# Patient Record
Sex: Male | Born: 1945 | Race: White | Hispanic: No | Marital: Married | Smoking: Never smoker
Health system: Southern US, Community
[De-identification: ages and names within clinical notes are randomized; demographics above are authoritative.]

## PROBLEM LIST (undated history)

## (undated) DIAGNOSIS — M1990 Unspecified osteoarthritis, unspecified site: Secondary | ICD-10-CM

## (undated) HISTORY — PX: BACK SURGERY: SHX140

---

## 2006-09-26 ENCOUNTER — Emergency Department (HOSPITAL_COMMUNITY): Admission: EM | Admit: 2006-09-26 | Discharge: 2006-09-27 | Payer: Self-pay | Admitting: Emergency Medicine

## 2006-10-15 ENCOUNTER — Ambulatory Visit (HOSPITAL_COMMUNITY): Admission: RE | Admit: 2006-10-15 | Discharge: 2006-10-16 | Payer: Self-pay | Admitting: Neurosurgery

## 2008-06-27 IMAGING — CR DG LUMBAR SPINE 2-3V
1 series · 1 of 1 positions shown · non-contrast
Comparison: None.

CLINICAL DATA: 60-year-old male with herniated nucleus pulposus, L4-5 diskectomy.
 LUMBAR SPINE - 2 VIEW:

[view not recorded]
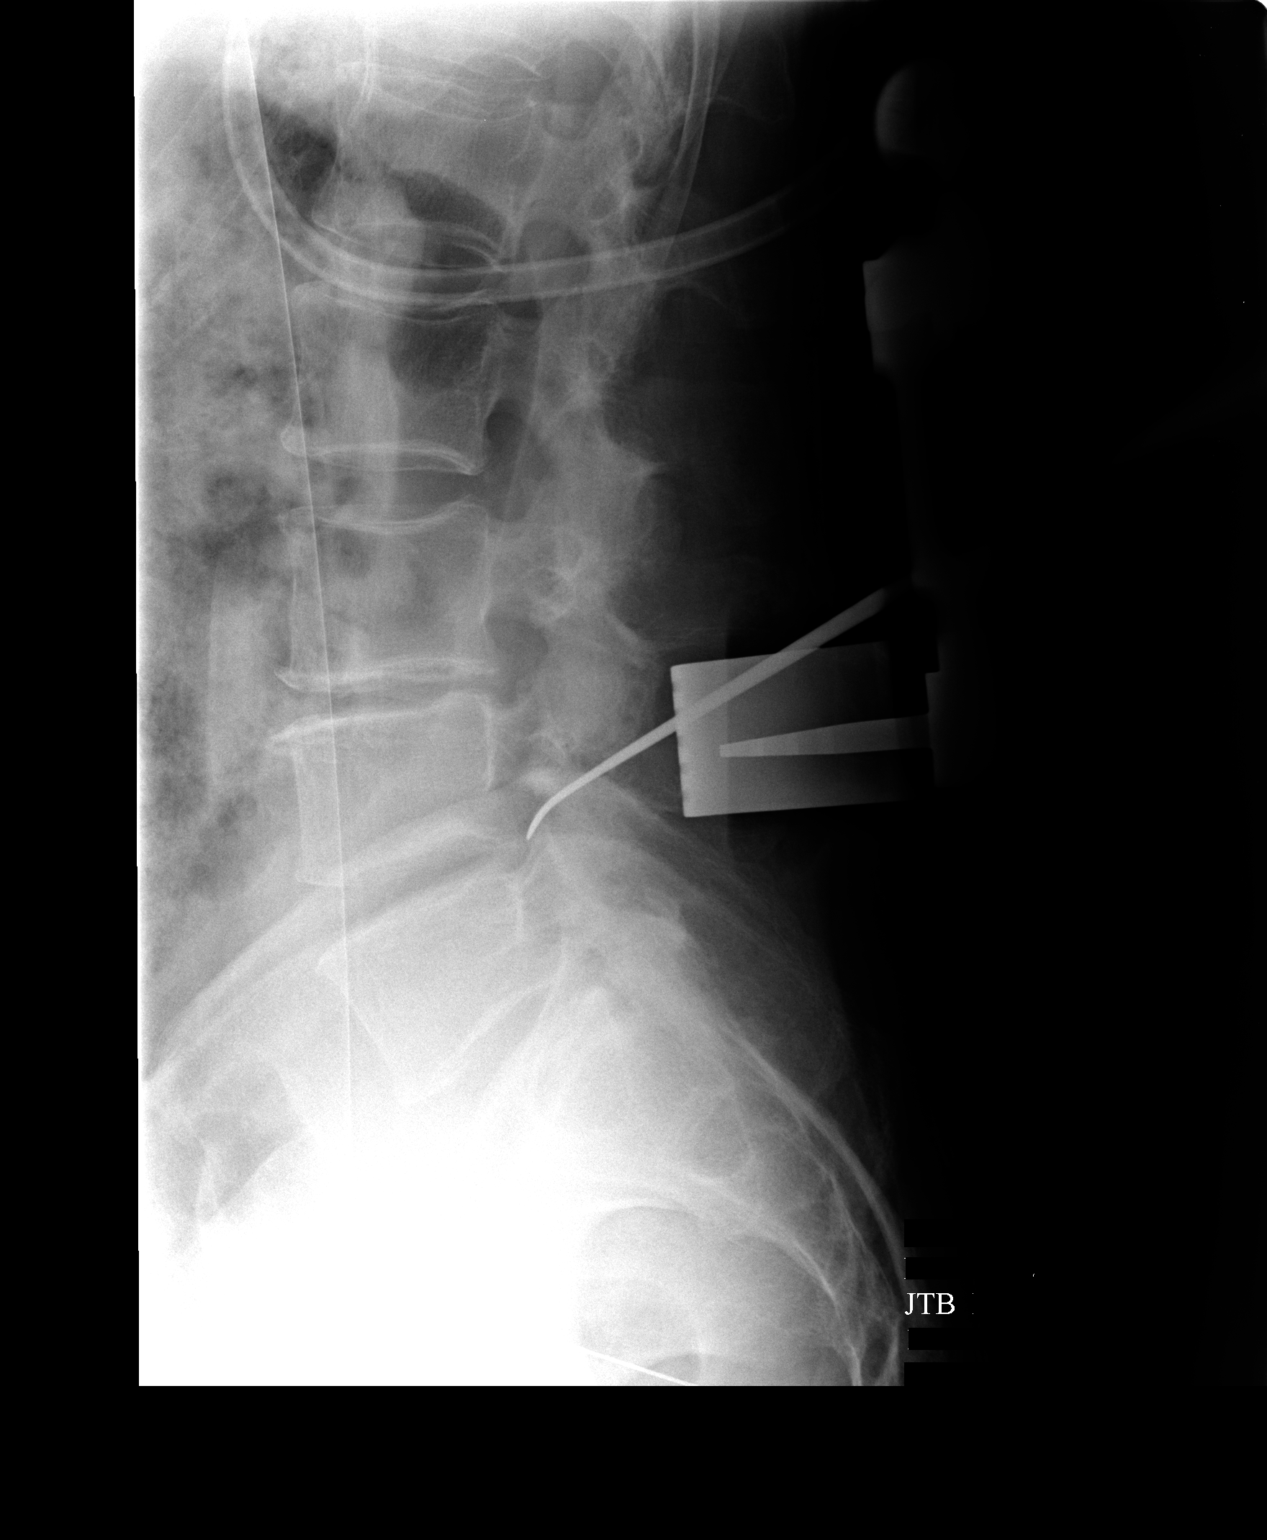

[1 of 1 positions shown; findings below may reference images not displayed]

FINDINGS: Two cross-table portable views of the lumbar spine performed at 5748 and 7986 hours. The earlier view demonstrates two surgical probes, one along the posterior aspect of the L4 level and the second just above the L4-5 disk space. The later film shows a probe at the level of the L4-L5 disk space.
IMPRESSION: Surgical localization as above.

## 2010-08-05 NOTE — Op Note (Signed)
[name], Bryan Mcgee               ACCOUNT NO.:  [id number]   MEDICAL RECORD NO.:  [id number]          PATIENT TYPE:  OIB   LOCATION:  5151                         FACILITY:  [facility]   PHYSICIAN:  Hilda Lias, M.D.   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   POSTOPERATIVE DIAGNOSES:  Right L4-5 herniated disk with 3-4  degenerative disk disease.   PROCEDURE:  Right L4 hemilaminectomy, removal of four large free  fragments at 4-5.  Lysis of adhesions, total gross diskectomy,  foraminotomy to decompress the L3 nerve root.   SURGEON:  Hilda Lias, M.D.   ASSISTANT:  Clydene Fake, M.D.   CLINICAL HISTORY:  The patient was seen in my office yesterday,  essentially with foot drop in the right leg.  The patient previously  about 20 years ago had surgery at the level 4-5.  The patient want to  proceed with surgery because of the amount of weakness he had as well as  pain.  The risks were explained to him in history and physical.   PROCEDURE:  The patient was taken to the OR and she was positioned in  prone manner.  The skin was cleaned with DuraPrep.  A midline incision  following the previous one was made.  We found immediately underneath  the subcutaneous tissue quite a bit of scar tissue.  There was quite a  bit of adhesion to the spinous process and lamina.  Retraction was made.  We started working our way down and drilling.  X-rays showed that indeed  that area was the level of 3-4.  Then we went one space below, we  repeated the x-ray and we were at the level 4-5.  At this level we did  find not find any scar tissue whatsoever.  We started working our way  until we found the yellow ligament.  Incision of the yellow ligament was  accomplished and we found the L4-5 disk space.  Here we  found that the  L4 nerve roots was quite adherent to the disk.  We did a lysis of  adhesion and after retraction we were able to remove four large  fragment, one going from 4-5 into the body of L5.  Nevertheless still  the L4 nerve root was really adherent to the canal.  Then we worked our  way up we did hemilaminectomy of L4.  Then we started working doing  quite a bit of lysis of adhesions which were compromising the dural sac  as well as the L3 and L4 nerve root.  Lysis was accomplished and at the  end we had good foraminotomy to decompress the L4 nerve root.  The L5  nerve root was quite open after we did a foraminotomy with 2 and 3  Kerrison punch.  From then on the area was irrigated.  Valsalva maneuver  was negative.  The wound was closed with Vicryl and Steri-Strips.  ______________________________  Hilda Lias, M.D.     EB/MEDQ  D:  10/15/2006  T:  10/16/2006  Job:  [id number]

## 2011-01-05 LAB — CBC
HCT: 39
Hemoglobin: 13.3
RDW: 12
WBC: 6

## 2011-01-05 LAB — BASIC METABOLIC PANEL
Calcium: 9.1
GFR calc Af Amer: >60
GFR calc non Af Amer: >60
Potassium: 4.2
Sodium: 141

## 2013-01-20 DIAGNOSIS — N400 Enlarged prostate without lower urinary tract symptoms: Secondary | ICD-10-CM | POA: Insufficient documentation

## 2016-01-09 DIAGNOSIS — E7800 Pure hypercholesterolemia, unspecified: Secondary | ICD-10-CM | POA: Insufficient documentation

## 2016-06-09 DIAGNOSIS — M545 Low back pain, unspecified: Secondary | ICD-10-CM | POA: Insufficient documentation

## 2016-06-09 DIAGNOSIS — G8929 Other chronic pain: Secondary | ICD-10-CM | POA: Insufficient documentation

## 2017-11-08 ENCOUNTER — Other Ambulatory Visit: Payer: Self-pay | Admitting: Neurosurgery

## 2017-11-08 DIAGNOSIS — M5416 Radiculopathy, lumbar region: Secondary | ICD-10-CM

## 2018-02-02 ENCOUNTER — Ambulatory Visit
Admission: RE | Admit: 2018-02-02 | Discharge: 2018-02-02 | Disposition: A | Discharge status: Home / Self Care | Payer: Medicare Other | Source: Ambulatory Visit | Attending: Neurosurgery | Admitting: Neurosurgery

## 2018-02-02 ENCOUNTER — Other Ambulatory Visit: Payer: Self-pay | Admitting: Neurosurgery

## 2018-02-02 DIAGNOSIS — M5416 Radiculopathy, lumbar region: Secondary | ICD-10-CM

## 2018-02-02 MED ORDER — METHYLPREDNISOLONE ACETATE 40 MG/ML INJ SUSP (RADIOLOG: 120.0000 mg | Freq: Once | INTRAMUSCULAR | Status: DC

## 2018-02-02 MED ORDER — IOPAMIDOL (ISOVUE-M 200) INJECTION 41%: 1.0000 mL | Freq: Once | INTRAMUSCULAR | Status: DC

## 2018-02-02 NOTE — Discharge Instructions (Signed)

## 2018-03-28 ENCOUNTER — Other Ambulatory Visit: Payer: Self-pay | Admitting: Neurosurgery

## 2018-03-28 DIAGNOSIS — M5416 Radiculopathy, lumbar region: Secondary | ICD-10-CM

## 2018-04-05 ENCOUNTER — Ambulatory Visit
Admission: RE | Admit: 2018-04-05 | Discharge: 2018-04-05 | Disposition: A | Discharge status: Home / Self Care | Payer: Medicare Other | Source: Ambulatory Visit | Attending: Neurosurgery | Admitting: Neurosurgery

## 2018-04-05 ENCOUNTER — Other Ambulatory Visit: Payer: Self-pay | Admitting: Neurosurgery

## 2018-04-05 DIAGNOSIS — M5416 Radiculopathy, lumbar region: Secondary | ICD-10-CM

## 2018-04-05 MED ORDER — METHYLPREDNISOLONE ACETATE 40 MG/ML INJ SUSP (RADIOLOG
120.0000 mg | Freq: Once | INTRAMUSCULAR | Status: AC
  Administered 2018-04-05: 120 mg via EPIDURAL

## 2018-04-05 MED ORDER — IOPAMIDOL (ISOVUE-M 200) INJECTION 41%
1.0000 mL | Freq: Once | INTRAMUSCULAR | Status: AC
  Administered 2018-04-05: 1 mL via EPIDURAL

## 2018-04-05 NOTE — Discharge Instructions (Signed)

## 2018-05-25 ENCOUNTER — Emergency Department (HOSPITAL_COMMUNITY): Payer: Medicare Other

## 2018-05-25 ENCOUNTER — Other Ambulatory Visit: Payer: Self-pay

## 2018-05-25 ENCOUNTER — Inpatient Hospital Stay (HOSPITAL_COMMUNITY)
Admission: EM | Admit: 2018-05-25 | Discharge: 2018-05-28 | DRG: 520 | Disposition: A | Discharge status: Home Health | Payer: Medicare Other | Attending: Neurosurgery | Admitting: Neurosurgery

## 2018-05-25 ENCOUNTER — Encounter (HOSPITAL_COMMUNITY): Payer: Self-pay | Admitting: Internal Medicine

## 2018-05-25 DIAGNOSIS — Z7982 Long term (current) use of aspirin: Secondary | ICD-10-CM | POA: Diagnosis not present

## 2018-05-25 DIAGNOSIS — T404X5A Adverse effect of other synthetic narcotics, initial encounter: Secondary | ICD-10-CM | POA: Diagnosis present

## 2018-05-25 DIAGNOSIS — M5116 Intervertebral disc disorders with radiculopathy, lumbar region: Secondary | ICD-10-CM | POA: Diagnosis present

## 2018-05-25 DIAGNOSIS — K5903 Drug induced constipation: Secondary | ICD-10-CM | POA: Diagnosis present

## 2018-05-25 DIAGNOSIS — M545 Low back pain: Secondary | ICD-10-CM | POA: Diagnosis present

## 2018-05-25 DIAGNOSIS — M5410 Radiculopathy, site unspecified: Secondary | ICD-10-CM | POA: Diagnosis present

## 2018-05-25 DIAGNOSIS — G8929 Other chronic pain: Secondary | ICD-10-CM | POA: Diagnosis not present

## 2018-05-25 DIAGNOSIS — Z791 Long term (current) use of non-steroidal anti-inflammatories (NSAID): Secondary | ICD-10-CM | POA: Diagnosis not present

## 2018-05-25 DIAGNOSIS — Z7401 Bed confinement status: Secondary | ICD-10-CM | POA: Diagnosis not present

## 2018-05-25 DIAGNOSIS — R300 Dysuria: Secondary | ICD-10-CM | POA: Diagnosis present

## 2018-05-25 DIAGNOSIS — M4726 Other spondylosis with radiculopathy, lumbar region: Secondary | ICD-10-CM | POA: Diagnosis present

## 2018-05-25 DIAGNOSIS — Z79899 Other long term (current) drug therapy: Secondary | ICD-10-CM

## 2018-05-25 DIAGNOSIS — M5126 Other intervertebral disc displacement, lumbar region: Secondary | ICD-10-CM | POA: Diagnosis present

## 2018-05-25 DIAGNOSIS — M5416 Radiculopathy, lumbar region: Secondary | ICD-10-CM | POA: Diagnosis present

## 2018-05-25 DIAGNOSIS — K5900 Constipation, unspecified: Secondary | ICD-10-CM | POA: Diagnosis present

## 2018-05-25 DIAGNOSIS — Z419 Encounter for procedure for purposes other than remedying health state, unspecified: Secondary | ICD-10-CM

## 2018-05-25 DIAGNOSIS — M1990 Unspecified osteoarthritis, unspecified site: Secondary | ICD-10-CM | POA: Diagnosis not present

## 2018-05-25 LAB — CBC WITH DIFFERENTIAL/PLATELET
ABS IMMATURE GRANULOCYTES: 0.01 10*3/uL (ref 0.00–0.07)
BASOS ABS: 0 10*3/uL (ref 0.0–0.1)
Basophils Relative: 0 %
EOS ABS: 0.1 10*3/uL (ref 0.0–0.5)
Eosinophils Relative: 1 %
HCT: 44.9 % (ref 39.0–52.0)
Hemoglobin: 14.7 g/dL (ref 13.0–17.0)
IMMATURE GRANULOCYTES: 0 %
LYMPHS ABS: 1.4 10*3/uL (ref 0.7–4.0)
Lymphocytes Relative: 25 %
MCH: 29.9 pg (ref 26.0–34.0)
MCHC: 32.7 g/dL (ref 30.0–36.0)
MCV: 91.4 fL (ref 80.0–100.0)
Monocytes Absolute: 0.5 10*3/uL (ref 0.1–1.0)
Monocytes Relative: 9 %
NEUTROS ABS: 3.7 10*3/uL (ref 1.7–7.7)
NEUTROS PCT: 65 %
NRBC: 0 % (ref 0.0–0.2)
PLATELETS: 209 10*3/uL (ref 150–400)
RBC: 4.91 MIL/uL (ref 4.22–5.81)
RDW: 12.2 % (ref 11.5–15.5)
WBC: 5.7 10*3/uL (ref 4.0–10.5)

## 2018-05-25 LAB — BASIC METABOLIC PANEL
ANION GAP: 9 (ref 5–15)
BUN: 14 mg/dL (ref 8–23)
CALCIUM: 9 mg/dL (ref 8.9–10.3)
CO2: 25 mmol/L (ref 22–32)
Chloride: 104 mmol/L (ref 98–111)
Creatinine, Ser: 0.97 mg/dL (ref 0.61–1.24)
Glucose, Bld: 98 mg/dL (ref 70–99)
POTASSIUM: 3.9 mmol/L (ref 3.5–5.1)
Sodium: 138 mmol/L (ref 135–145)

## 2018-05-25 LAB — URINALYSIS, ROUTINE W REFLEX MICROSCOPIC
Bilirubin Urine: NEGATIVE
Glucose, UA: NEGATIVE mg/dL
HGB URINE DIPSTICK: NEGATIVE
Ketones, ur: NEGATIVE mg/dL
LEUKOCYTE UA: NEGATIVE
Nitrite: NEGATIVE
Protein, ur: NEGATIVE mg/dL
SPECIFIC GRAVITY, URINE: 1.01 (ref 1.005–1.030)
pH: 7 (ref 5.0–8.0)

## 2018-05-25 MED ORDER — DEXAMETHASONE SODIUM PHOSPHATE 10 MG/ML IJ SOLN
10.0000 mg | Freq: Once | INTRAMUSCULAR | Status: AC
  Administered 2018-05-25: 10 mg via INTRAVENOUS
  Filled 2018-05-25: qty 1

## 2018-05-25 MED ORDER — DIAZEPAM 5 MG PO TABS: 5.0000 mg | ORAL_TABLET | Freq: Three times a day (TID) | ORAL | Status: DC | PRN

## 2018-05-25 MED ORDER — GADOBUTROL 1 MMOL/ML IV SOLN
8.0000 mL | Freq: Once | INTRAVENOUS | Status: AC | PRN
  Administered 2018-05-25: 8 mL via INTRAVENOUS

## 2018-05-25 MED ORDER — HYDROMORPHONE HCL 1 MG/ML IJ SOLN
1.0000 mg | Freq: Once | INTRAMUSCULAR | Status: AC
  Administered 2018-05-25: 1 mg via INTRAVENOUS
  Filled 2018-05-25: qty 1

## 2018-05-25 MED ORDER — POLYETHYLENE GLYCOL 3350 17 G PO PACK
17.0000 g | PACK | Freq: Every day | ORAL | Status: DC
  Administered 2018-05-26 (×2): 17 g via ORAL
  Filled 2018-05-25 (×2): qty 1

## 2018-05-25 MED ORDER — DIAZEPAM 5 MG/ML IJ SOLN
5.0000 mg | Freq: Once | INTRAMUSCULAR | Status: AC
  Administered 2018-05-25: 5 mg via INTRAVENOUS
  Filled 2018-05-25: qty 2

## 2018-05-25 MED ORDER — HYDROMORPHONE HCL 1 MG/ML IJ SOLN
1.0000 mg | INTRAMUSCULAR | Status: DC | PRN
  Administered 2018-05-26: 1 mg via INTRAVENOUS
  Filled 2018-05-25: qty 1

## 2018-05-25 MED ORDER — SODIUM CHLORIDE 0.9 % IV BOLUS
1000.0000 mL | Freq: Once | INTRAVENOUS | Status: AC
  Administered 2018-05-25: 1000 mL via INTRAVENOUS

## 2018-05-25 MED ORDER — ONDANSETRON HCL 4 MG/2ML IJ SOLN
4.0000 mg | Freq: Four times a day (QID) | INTRAMUSCULAR | Status: DC | PRN
  Administered 2018-05-26: 4 mg via INTRAVENOUS
  Filled 2018-05-25: qty 2

## 2018-05-25 MED ORDER — ONDANSETRON HCL 4 MG PO TABS: 4.0000 mg | ORAL_TABLET | Freq: Four times a day (QID) | ORAL | Status: DC | PRN

## 2018-05-25 MED ORDER — ENOXAPARIN SODIUM 40 MG/0.4ML ~~LOC~~ SOLN
40.0000 mg | Freq: Every day | SUBCUTANEOUS | Status: DC
  Administered 2018-05-26: 40 mg via SUBCUTANEOUS
  Filled 2018-05-25: qty 0.4

## 2018-05-25 MED ORDER — SENNOSIDES-DOCUSATE SODIUM 8.6-50 MG PO TABS
1.0000 | ORAL_TABLET | Freq: Two times a day (BID) | ORAL | Status: DC
  Administered 2018-05-26 – 2018-05-27 (×4): 1 via ORAL
  Filled 2018-05-25 (×4): qty 1
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
## 2018-05-25 NOTE — Consult Note (Signed)
Reason for Consult:lumbar disc herniation Referring Physician: EDP  Bryan Mcgee is an 73 y.o. male.   HPI:  Pleasant 73 year old presented to the ED today after progressive pain in his lower back over the last 2 weeks. He has a history of lower back and right leg pain since last august. He has been seeing Dr. Jordan Likes for this. He has had two epidural steroid injections with little relief. His pain radiates from his lower back to his right leg into the top of his foot to his big toe. Endorses NT and some weakness when trying to ambulate. Has been taking tramadol and tylenol for the pain with little relief. He has been unable to walk because of the pain  History reviewed. No pertinent past medical history.  History reviewed. No pertinent surgical history.  No Known Allergies  Social History   Tobacco Use  . Smoking status: Never Smoker  Substance Use Topics  . Alcohol use: Never    Frequency: Never    Family History  Problem Relation Age of Onset  . Melanoma Mother   . Subarachnoid hemorrhage Father   . Heart failure Father      Review of Systems  Positive ROS: as above  All other systems have been reviewed and were otherwise negative with the exception of those mentioned in the HPI and as above.  Objective: Vital signs in last 24 hours: Temp:  [97.8 F (36.6 C)] 97.8 F (36.6 C) (03/04 1153) Pulse Rate:  [55-72] 68 (03/04 2100) Resp:  [16-18] 16 (03/04 1500) BP: (80-150)/(53-88) 107/54 (03/04 2100) SpO2:  [90 %-99 %] 95 % (03/04 2100)  General Appearance: Alert, cooperative, no distress, appears stated age Head: Normocephalic, without obvious abnormality, atraumatic Eyes: PERRL, conjunctiva/corneas clear, EOM's intact, fundi benign, both eyes      Lungs: respirations unlabored Heart: Regular rate and rhythm NEUROLOGIC:   Mental status: A&O x4, no aphasia, good attention span, Memory and fund of knowledge Motor Exam - grossly normal, normal tone and bulk, strength in  lower extremities is 5/5- EHL, knee extension, hip flexion, dorsi and plantar flexion completely intact.  Sensory Exam - grossly normal Reflexes: symmetric, no pathologic reflexes, No Hoffman's, No clonus Coordination - grossly normal Gait -unable to test Balance - unable to test Cranial Nerves: I: smell Not tested  II: visual acuity  OS: na    OD: na  II: visual fields Full to confrontation  II: pupils Equal, round, reactive to light  III,VII: ptosis None  III,IV,VI: extraocular muscles  Full ROM  V: mastication   V: facial light touch sensation    V,VII: corneal reflex    VII: facial muscle function - upper    VII: facial muscle function - lower   VIII: hearing   IX: soft palate elevation    IX,X: gag reflex   XI: trapezius strength    XI: sternocleidomastoid strength   XI: neck flexion strength    XII: tongue strength      Data Review Lab Results  Component Value Date   WBC 5.7 05/25/2018   HGB 14.7 05/25/2018   HCT 44.9 05/25/2018   MCV 91.4 05/25/2018   PLT 209 05/25/2018   Lab Results  Component Value Date   NA 138 05/25/2018   K 3.9 05/25/2018   CL 104 05/25/2018   CO2 25 05/25/2018   BUN 14 05/25/2018   CREATININE 0.97 05/25/2018   GLUCOSE 98 05/25/2018   No results found for: INR, PROTIME  Radiology: Mr Lumbar Spine W Wo Contrast  Result Date: 05/25/2018 CLINICAL DATA:  Worsening back pain with inability to walk. EXAM: MRI LUMBAR SPINE WITHOUT AND WITH CONTRAST TECHNIQUE: Multiplanar and multiecho pulse sequences of the lumbar spine were obtained without and with intravenous contrast. CONTRAST:  8 mL Gadavist intravenous contrast. COMPARISON:  MRI lumbar spine dated October 27, 2017. FINDINGS: Segmentation:  Standard. Alignment:  Unchanged trace anterolisthesis at L2-L3 at L5-S1. Vertebrae:  No fracture, evidence of discitis, or bone lesion. Conus medullaris and cauda equina: Conus extends to the T12-L1 level. Conus and cauda equina appear normal. No intradural  enhancement. Paraspinal and other soft tissues: Negative. Disc levels: T12-L1:  Negative. L1-L2:  Negative. L2-L3: Unchanged mild disc bulging and bilateral facet arthropathy. No stenosis. L3-L4: Prior right hemilaminectomy. Unchanged disc bulging with superimposed right subarticular disc extrusion migrating inferiorly likely impinging on the descending right L4 nerve root. Unchanged superimposed left subarticular disc protrusion and annular fissure impinging on the descending left L4 nerve root. No spinal canal or neuroforaminal stenosis. L4-L5: Prior right hemilaminectomy. Unchanged mild disc bulging with new large right paracentral and subarticular disc protrusion resulting in severe right lateral recess stenosis with impingement of the descending right L5 nerve root. Mild bilateral facet arthropathy. No spinal canal or neuroforaminal stenosis. L5-S1: Unchanged shallow broad-based posterior disc protrusion with right subarticular annular fissure potentially irritating the descending right S1 nerve root. Unchanged mild bilateral facet arthropathy. No spinal canal or neuroforaminal stenosis. IMPRESSION: 1. New large right paracentral and subarticular disc protrusion at L4-L5 with impingement of the descending right L5 nerve root. 2. Unchanged right and left subarticular disc herniations at L3-L4 with impingement of the bilateral descending L4 nerve roots. 3. Unchanged shallow disc herniation and right subarticular annular fissure at L5-S1 potentially irritating the descending right S1 nerve root. Electronically Signed   By: Obie Dredge M.D.   On: 05/25/2018 17:26     Assessment/Plan: 73 year old patient presented to the ED today for unrelenting back and right leg pain no responsive to conservative treatments. MRI lumbar shows a large disc herniation paracentral to the right contacting his L5 nerve root. I did not notice any weakness on exam, therefore I do not think any acute surgical intervention is  warranted at this time. Medicine will admit him for pain management and will make Dr. Jordan Likes aware of patient tomorrow for surgical planning if he deems necessary.    Tiana Loft Cambrey Lupi 05/25/2018 9:42 PM

## 2018-05-25 NOTE — ED Notes (Signed)
Pt returned from MRI °

## 2018-05-25 NOTE — ED Triage Notes (Signed)
Pt to ED via private vehicle -- came on a mattress/on a piece of plywood moved to an ED stretcher on same mattress.  C/o back pain, hx of 2 back surgeries/ 2 injections. Could not get in elevator to see dr Dutch Quint today at the office.

## 2018-05-25 NOTE — ED Notes (Signed)
ED TO INPATIENT HANDOFF REPORT  ED Nurse Name and Phone #:  Woodroe Chen, RN [phone]  S Name/Age/Gender Bryan Mcgee 73 y.o. male Room/Bed: 039C/039C  Code Status   Code Status: Full Code  Home/SNF/Other Home {Patient oriented to: Alert and oriented x's 4 Is this baseline? Yes  Triage Complete: Triage complete  Chief Complaint back pain  Triage Note Pt to ED via private vehicle -- came on a mattress/on a piece of plywood moved to an ED stretcher on same mattress.  C/o back pain, hx of 2 back surgeries/ 2 injections. Could not get in elevator to see dr Dutch Quint today at the office.    Allergies No Known Allergies  Level of Care/Admitting Diagnosis ED Disposition    ED Disposition Condition Comment   Admit  Hospital Area: [facility] [[id number]]  Level of Care: Med-Surg [16]  Diagnosis: Bulging lumbar disc [700392]  Admitting Physician: Levert Feinstein [[id number]]  Attending Physician: Levert Feinstein [[id number]]  Estimated length of stay: past midnight tomorrow  Certification:: I certify this patient will need inpatient services for at least 2 midnights  PT Class (Do Not Modify): Inpatient [101]  PT Acc Code (Do Not Modify): Private [1]       B Medical/Surgery History History reviewed. No pertinent past medical history. History reviewed. No pertinent surgical history.   A IV Location/Drains/Wounds Patient Lines/Drains/Airways Status   Active Line/Drains/Airways    Name:   Placement date:   Placement time:   Site:   Days:   Peripheral IV 05/25/18 Left Antecubital   05/25/18    1442    Antecubital   less than 1          Intake/Output Last 24 hours No intake or output data in the 24 hours ending 05/25/18 2238  Labs/Imaging Results for orders placed or performed during the hospital encounter of 05/25/18 (from the past 48 hour(s))  Urinalysis, Routine w reflex microscopic     Status: None   Collection Time: 05/25/18  2:36 PM  Result Value  Ref Range   Color, Urine YELLOW YELLOW   APPearance CLEAR CLEAR   Specific Gravity, Urine 1.010 1.005 - 1.030   pH 7.0 5.0 - 8.0   Glucose, UA NEGATIVE NEGATIVE mg/dL   Hgb urine dipstick NEGATIVE NEGATIVE   Bilirubin Urine NEGATIVE NEGATIVE   Ketones, ur NEGATIVE NEGATIVE mg/dL   Protein, ur NEGATIVE NEGATIVE mg/dL   Nitrite NEGATIVE NEGATIVE   Leukocytes,Ua NEGATIVE NEGATIVE    Comment: Performed at [facility], [address] [place], [place] [address]  CBC with Differential     Status: None   Collection Time: 05/25/18  4:09 PM  Result Value Ref Range   WBC 5.7 4.0 - 10.5 K/uL   RBC 4.91 4.22 - 5.81 MIL/uL   Hemoglobin 14.7 13.0 - 17.0 g/dL   HCT 81.1 91.4 - 78.2 %   MCV 91.4 80.0 - 100.0 fL   MCH 29.9 26.0 - 34.0 pg   MCHC 32.7 30.0 - 36.0 g/dL   RDW 95.6 21.3 - 08.6 %   Platelets 209 150 - 400 K/uL   nRBC 0.0 0.0 - 0.2 %   Neutrophils Relative % 65 %   Neutro Abs 3.7 1.7 - 7.7 K/uL   Lymphocytes Relative 25 %   Lymphs Abs 1.4 0.7 - 4.0 K/uL   Monocytes Relative 9 %   Monocytes Absolute 0.5 0.1 - 1.0 K/uL   Eosinophils Relative 1 %  Eosinophils Absolute 0.1 0.0 - 0.5 K/uL   Basophils Relative 0 %   Basophils Absolute 0.0 0.0 - 0.1 K/uL   Immature Granulocytes 0 %   Abs Immature Granulocytes 0.01 0.00 - 0.07 K/uL    Comment: Performed at [facility], [address], [place], [place] [address]  Basic metabolic panel     Status: None   Collection Time: 05/25/18  4:09 PM  Result Value Ref Range   Sodium 138 135 - 145 mmol/L   Potassium 3.9 3.5 - 5.1 mmol/L   Chloride 104 98 - 111 mmol/L   CO2 25 22 - 32 mmol/L   Glucose, Bld 98 70 - 99 mg/dL   BUN 14 8 - 23 mg/dL   Creatinine, Ser 9.70 0.61 - 1.24 mg/dL   Calcium 9.0 8.9 - 26.3 mg/dL   GFR calc non Af Amer >60 >60 mL/min   GFR calc Af Amer >60 >60 mL/min   Anion gap 9 5 - 15    Comment: Performed at [facility], [address], [place], [place] [address]   Mr Lumbar Spine W Wo  Contrast  Result Date: 05/25/2018 CLINICAL DATA:  Worsening back pain with inability to walk. EXAM: MRI LUMBAR SPINE WITHOUT AND WITH CONTRAST TECHNIQUE: Multiplanar and multiecho pulse sequences of the lumbar spine were obtained without and with intravenous contrast. CONTRAST:  8 mL Gadavist intravenous contrast. COMPARISON:  MRI lumbar spine dated October 27, 2017. FINDINGS: Segmentation:  Standard. Alignment:  Unchanged trace anterolisthesis at L2-L3 at L5-S1. Vertebrae:  No fracture, evidence of discitis, or bone lesion. Conus medullaris and cauda equina: Conus extends to the T12-L1 level. Conus and cauda equina appear normal. No intradural enhancement. Paraspinal and other soft tissues: Negative. Disc levels: T12-L1:  Negative. L1-L2:  Negative. L2-L3: Unchanged mild disc bulging and bilateral facet arthropathy. No stenosis. L3-L4: Prior right hemilaminectomy. Unchanged disc bulging with superimposed right subarticular disc extrusion migrating inferiorly likely impinging on the descending right L4 nerve root. Unchanged superimposed left subarticular disc protrusion and annular fissure impinging on the descending left L4 nerve root. No spinal canal or neuroforaminal stenosis. L4-L5: Prior right hemilaminectomy. Unchanged mild disc bulging with new large right paracentral and subarticular disc protrusion resulting in severe right lateral recess stenosis with impingement of the descending right L5 nerve root. Mild bilateral facet arthropathy. No spinal canal or neuroforaminal stenosis. L5-S1: Unchanged shallow broad-based posterior disc protrusion with right subarticular annular fissure potentially irritating the descending right S1 nerve root. Unchanged mild bilateral facet arthropathy. No spinal canal or neuroforaminal stenosis. IMPRESSION: 1. New large right paracentral and subarticular disc protrusion at L4-L5 with impingement of the descending right L5 nerve root. 2. Unchanged right and left subarticular disc  herniations at L3-L4 with impingement of the bilateral descending L4 nerve roots. 3. Unchanged shallow disc herniation and right subarticular annular fissure at L5-S1 potentially irritating the descending right S1 nerve root. Electronically Signed   By: Obie Dredge M.D.   On: 05/25/2018 17:26    Pending Labs Unresulted Labs (From admission, onward)    Start     Ordered   05/26/18 0500  Basic metabolic panel  Tomorrow morning,   R     05/25/18 2046   05/26/18 0500  CBC  Tomorrow morning,   R     05/25/18 2046   05/25/18 1426  Urine culture  ONCE - STAT,   STAT     05/25/18 1425  Vitals/Pain Today's Vitals   05/25/18 2000 05/25/18 2015 05/25/18 2045 05/25/18 2100  BP: 112/69 109/79 (!) 116/58 (!) 107/54  Pulse: 60 72 64 68  Resp:      Temp:      TempSrc:      SpO2: 94% 97% 95% 95%  PainSc:        Isolation Precautions No active isolations  Medications Medications  enoxaparin (LOVENOX) injection 40 mg (has no administration in time range)  polyethylene glycol (MIRALAX / GLYCOLAX) packet 17 g (has no administration in time range)  senna-docusate (Senokot-S) tablet 1 tablet (has no administration in time range)  ondansetron (ZOFRAN) tablet 4 mg (has no administration in time range)    Or  ondansetron (ZOFRAN) injection 4 mg (has no administration in time range)  HYDROmorphone (DILAUDID) injection 1 mg (has no administration in time range)  diazepam (VALIUM) tablet 5 mg (has no administration in time range)  HYDROmorphone (DILAUDID) injection 1 mg (1 mg Intravenous Given 05/25/18 1444)  diazepam (VALIUM) injection 5 mg (5 mg Intravenous Given 05/25/18 1444)  gadobutrol (GADAVIST) 1 MMOL/ML injection 8 mL (8 mLs Intravenous Contrast Given 05/25/18 1706)  diazepam (VALIUM) injection 5 mg (5 mg Intravenous Given 05/25/18 1921)  HYDROmorphone (DILAUDID) injection 1 mg (1 mg Intravenous Given 05/25/18 1927)  sodium chloride 0.9 % bolus 1,000 mL (1,000 mLs Intravenous New  Bag/Given 05/25/18 2101)  dexamethasone (DECADRON) injection 10 mg (10 mg Intravenous Given 05/25/18 2102)    Mobility Pt nonambulatory     Focused Assessments back pain   R Recommendations: See Admitting Provider Note  Report given to:   Additional Notes:  Attempted to walk pt in room, pt unable to ambulate

## 2018-05-25 NOTE — ED Notes (Signed)
Pt to MRI at this time.

## 2018-05-26 ENCOUNTER — Encounter (HOSPITAL_COMMUNITY): Payer: Self-pay | Admitting: Neurology

## 2018-05-26 ENCOUNTER — Other Ambulatory Visit: Payer: Self-pay | Admitting: Neurosurgery

## 2018-05-26 ENCOUNTER — Other Ambulatory Visit: Payer: Self-pay

## 2018-05-26 DIAGNOSIS — G8929 Other chronic pain: Secondary | ICD-10-CM

## 2018-05-26 DIAGNOSIS — K5900 Constipation, unspecified: Secondary | ICD-10-CM | POA: Diagnosis present

## 2018-05-26 DIAGNOSIS — K5903 Drug induced constipation: Secondary | ICD-10-CM

## 2018-05-26 DIAGNOSIS — M5116 Intervertebral disc disorders with radiculopathy, lumbar region: Principal | ICD-10-CM

## 2018-05-26 DIAGNOSIS — M1990 Unspecified osteoarthritis, unspecified site: Secondary | ICD-10-CM

## 2018-05-26 DIAGNOSIS — T404X5A Adverse effect of other synthetic narcotics, initial encounter: Secondary | ICD-10-CM

## 2018-05-26 DIAGNOSIS — M5410 Radiculopathy, site unspecified: Secondary | ICD-10-CM | POA: Diagnosis present

## 2018-05-26 LAB — BASIC METABOLIC PANEL
Anion gap: 9 (ref 5–15)
BUN: 18 mg/dL (ref 8–23)
CO2: 25 mmol/L (ref 22–32)
Calcium: 9.1 mg/dL (ref 8.9–10.3)
Chloride: 103 mmol/L (ref 98–111)
Creatinine, Ser: 0.98 mg/dL (ref 0.61–1.24)
GFR calc Af Amer: >60 mL/min (ref >60)
GFR calc non Af Amer: >60 mL/min (ref >60)
GLUCOSE: 157 mg/dL — AB (ref 70–99)
Potassium: 4.5 mmol/L (ref 3.5–5.1)
Sodium: 137 mmol/L (ref 135–145)

## 2018-05-26 LAB — CBC
HCT: 43.1 % (ref 39.0–52.0)
Hemoglobin: 14.1 g/dL (ref 13.0–17.0)
MCH: 29.8 pg (ref 26.0–34.0)
MCHC: 32.7 g/dL (ref 30.0–36.0)
MCV: 91.1 fL (ref 80.0–100.0)
Platelets: 220 10*3/uL (ref 150–400)
RBC: 4.73 MIL/uL (ref 4.22–5.81)
RDW: 12.2 % (ref 11.5–15.5)
WBC: 3.8 10*3/uL — ABNORMAL LOW (ref 4.0–10.5)
nRBC: 0 % (ref 0.0–0.2)

## 2018-05-26 LAB — URINE CULTURE: Culture: NO GROWTH

## 2018-05-26 MED ORDER — GABAPENTIN 100 MG PO CAPS
100.0000 mg | ORAL_CAPSULE | Freq: Two times a day (BID) | ORAL | Status: DC
  Administered 2018-05-26 – 2018-05-28 (×4): 100 mg via ORAL
  Filled 2018-05-26 (×4): qty 1

## 2018-05-26 MED ORDER — ACETAMINOPHEN 325 MG PO TABS
650.0000 mg | ORAL_TABLET | Freq: Four times a day (QID) | ORAL | Status: DC | PRN
  Administered 2018-05-26 – 2018-05-27 (×3): 650 mg via ORAL
  Filled 2018-05-26 (×3): qty 2

## 2018-05-26 MED ORDER — FLEET ENEMA 7-19 GM/118ML RE ENEM
1.0000 | ENEMA | Freq: Once | RECTAL | Status: AC
  Administered 2018-05-26: 1 via RECTAL
  Filled 2018-05-26: qty 1

## 2018-05-26 MED ORDER — OXYCODONE-ACETAMINOPHEN 5-325 MG PO TABS
1.0000 | ORAL_TABLET | ORAL | Status: DC | PRN
  Administered 2018-05-26: 1 via ORAL
  Filled 2018-05-26: qty 1

## 2018-05-26 MED ORDER — PROMETHAZINE HCL 25 MG PO TABS: 25.0000 mg | ORAL_TABLET | Freq: Four times a day (QID) | ORAL | Status: DC | PRN

## 2018-05-26 MED ORDER — GABAPENTIN 100 MG PO CAPS
100.0000 mg | ORAL_CAPSULE | Freq: Two times a day (BID) | ORAL | Status: DC
  Filled 2018-05-26: qty 1

## 2018-05-26 MED ORDER — PROMETHAZINE HCL 25 MG/ML IJ SOLN: 12.5000 mg | Freq: Four times a day (QID) | INTRAMUSCULAR | Status: DC | PRN

## 2018-05-26 NOTE — Care Management Note (Signed)
Case Management Note  Patient Details  Name: Bryan Mcgee MRN: [id number] Date of Birth: 07-Apr-1945  Subjective/Objective:  Bulging Lumbar disc                 Action/Plan: Patient lives at home with spouse; PCP is Barron Alvine, MD; has private insurance with Medicare; CM following for progression of care.  Expected Discharge Date:    possibly 05/30/2018              Expected Discharge Plan:  Home w Home Health Services possibly  Discharge planning Services  CM Consult  Status of Service:  In process, will continue to follow  Reola Mosher [phone] 05/26/2018, 8:20 AM

## 2018-05-26 NOTE — Evaluation (Addendum)
Physical Therapy Evaluation Patient Details Name: Bryan Mcgee MRN: [id number] DOB: 12/14/1945 Today's Date: 05/26/2018   History of Present Illness  Pt is a 73 y/o male admitted secondary to ongoing severe back pain. 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 NSG was consulted and recommending surgery. No pertinent PMH in EMR.    Clinical Impression  Pt presented supine in bed with HOB elevated, awake and willing to participate in therapy session. Prior to admission, pt reported that he was independent with all functional mobility and ADLs. Pt is very limited secondary to significant low back pain. Only able to tolerate sitting EOB for 2-3 seconds and then needing to lie back down. Pt moving well in bed considering his pain levels; however, unable to stand or ambulate at this time. Pt would continue to benefit from skilled physical therapy services at this time while admitted and after d/c to address the below listed limitations in order to improve overall safety and independence with functional mobility.     Follow Up Recommendations Home health PT;Supervision/Assistance - 24 hour    Equipment Recommendations  None recommended by PT    Recommendations for Other Services       Precautions / Restrictions Precautions Precautions: Fall;Back Restrictions Weight Bearing Restrictions: No      Mobility  Bed Mobility Overal bed mobility: Needs Assistance Bed Mobility: Rolling;Sidelying to Sit;Sit to Sidelying Rolling: Supervision Sidelying to sit: Supervision     Sit to sidelying: Supervision General bed mobility comments: cueing for log roll technique for comfort  Transfers                 General transfer comment: pt deferred; after briefly sitting EOB (~2-3 seconds) pt reporting  that pain was increasing and he needed to lie back down in bed  Ambulation/Gait                Stairs            Wheelchair Mobility    Modified Rankin (Stroke Patients Only)       Balance Overall balance assessment: Needs assistance Sitting-balance support: Feet supported Sitting balance-Leahy Scale: Good                                       Pertinent Vitals/Pain Pain Assessment: Faces Faces Pain Scale: Hurts even more Pain Location: lower back Pain Descriptors / Indicators: Aching;Burning;Guarding;Sharp;Shooting;Sore;Throbbing Pain Intervention(s): Monitored during session;Repositioned;Patient requesting pain meds-RN notified    Home Living Family/patient expects to be discharged to:: Private residence Living Arrangements: Children;Other relatives Available Help at Discharge: Family;Available 24 hours/day Type of Home: House Home Access: Ramped entrance     Home Layout: Able to live on main level with bedroom/bathroom Home Equipment: Cane - single point;Crutches;Shower seat      Prior Function Level of Independence: Independent               Hand Dominance        Extremity/Trunk Assessment   Upper Extremity Assessment Upper Extremity Assessment: Overall WFL for tasks assessed    Lower Extremity Assessment Lower Extremity Assessment: Overall WFL for tasks assessed       Communication   Communication: No difficulties  Cognition Arousal/Alertness:  Awake/alert Behavior During Therapy: WFL for tasks assessed/performed Overall Cognitive Status: Within Functional Limits for tasks assessed                                        General Comments      Exercises     Assessment/Plan    PT Assessment Patient needs continued PT services  PT Problem List Decreased activity tolerance;Decreased mobility;Pain       PT Treatment Interventions DME instruction;Gait training;Stair training;Functional mobility  training;Therapeutic activities;Therapeutic exercise;Balance training;Neuromuscular re-education;Patient/family education    PT Goals (Current goals can be found in the Care Plan section)  Acute Rehab PT Goals Patient Stated Goal: decrease pain PT Goal Formulation: With patient Time For Goal Achievement: 06/09/18 Potential to Achieve Goals: Good    Frequency Min 3X/week   Barriers to discharge        Co-evaluation               AM-PAC PT "6 Clicks" Mobility  Outcome Measure Help needed turning from your back to your side while in a flat bed without using bedrails?: None Help needed moving from lying on your back to sitting on the side of a flat bed without using bedrails?: None Help needed moving to and from a bed to a chair (including a wheelchair)?: A Little Help needed standing up from a chair using your arms (e.g., wheelchair or bedside chair)?: A Little Help needed to walk in hospital room?: A Little Help needed climbing 3-5 steps with a railing? : A Lot 6 Click Score: 19    End of Session   Activity Tolerance: Patient limited by pain Patient left: in bed;with call bell/phone within reach;with bed alarm set Nurse Communication: Mobility status;Patient requests pain meds PT Visit Diagnosis: Other abnormalities of gait and mobility (R26.89);Pain Pain - part of body: (back)    Time: 1937-9024 PT Time Calculation (min) (ACUTE ONLY): 17 min   Charges:   PT Evaluation $PT Eval Moderate Complexity: 1 Mod          Deborah Chalk, PT, DPT  Acute Rehabilitation Services Pager [phone] Office [phone]    Alessandra Bevels Yvonne Petite 05/26/2018, 11:14 AM

## 2018-05-26 NOTE — Progress Notes (Signed)
Pt states he has not had a bowel movement in 6 days. He states he is feeling very uncomfortable and like his "stomach is goar, patient is requesting fleet enema. MD notified, oing to explode". Miralax and senokot ineffective so frders obtained for enema. Will continue to monitor.

## 2018-05-26 NOTE — Progress Notes (Signed)
   Subjective: Bryan Mcgee reported feeling okay this morning. He states at rest his pain is about a 2/10 but anytime he sits or stands up it is a 10/10. He states he has had chronic lower back pain for the past year but it got significantly worse in the last couple of months. For the past week he has been unable to get out of bed. He has not had a BM in 6 days due the pain. He is not sure if he is having any weakness or if it is just due to his pain. He denies any urinary incontinence.   Objective:  Vital signs in last 24 hours: Vitals:   05/25/18 2045 05/25/18 2100 05/26/18 0049 05/26/18 0316  BP: (!) 116/58 (!) 107/54 (!) 136/98 (!) 118/50  Pulse: 64 68 63 65  Resp:   16 18  Temp:    97.8 F (36.6 C)  TempSrc:    Oral  SpO2: 95% 95% 95% 95%  Weight:    87.7 kg  Height:    6' (1.829 m)   Gen: comfortably resting in bed, no distress CV: RRR, no murmurs Skin: warm and dry Neuro: normal mood and affect   Assessment/Plan:  Active Problems:   Bulging lumbar disc  73yo male with chronic back pain s/p two lumbar surgeries who presents with worsening back pain and inability to walk found to have new L4-L5 disc protrusion with L5 nerve root impingement.   Bulging lumbar disc - Presents with 1 month of worsening pain, 2 weeks of right leg numbness, and 6 days of being bed bound 2/2 pain. Neuro exam is intact. MRI showed new L4-5 disc protrusion with L5 impingement.  - Neurosurgery is planning a right-sided L3-4 redo decompressive laminotomy and microdiscectomy and right-sided L4-5 decompressive redo laminotomy and microdiscectomy for tomorrow  - NPO at midnight - po diazepam 5mg  q8h prn - Switched IV dilaudid 1mg  q4h prn to oxycodone 5 mg q4h prn  - PT recommended home health PT  Constipation - 2/2 tramadol use, immobility, and difficulty straining due to low back pain - Last BM was 6 days ago.  - scheduled miralax and senokot   Dispo: Anticipated discharge pending clinical  improvement after surgery.   Jaci Standard, DO 05/26/2018, 7:13 AM Pager: [phone]

## 2018-05-26 NOTE — Progress Notes (Addendum)
Spoke with Dr. Jordan Likes, neurosurgery will be taking over as the primary team starting tomorrow 3/6. We will sign off tomorrow morning.     Jaci Standard, DO 05/26/2018, 2:54 PM Pager: [phone]

## 2018-05-26 NOTE — Progress Notes (Signed)
Patient admitted last night with severe back and radicular pain ongoing for more than 1 month.  Symptoms of failed epidural steroid injections.  Pain symptoms have changed in character.  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  MRI scan done last night demonstrates evidence of stable appearance of his recurrent disc herniation on the right at L3-4.  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  The patient is miserable.  He is unable to stand.  He is unable to care for himself.  He has minimal left-sided symptoms.  He has no bowel or bladder dysfunction.  He is not having any ongoing weakness however.  On examination.,  He is awake and alert.  He is oriented and appropriate.  Speech is fluent.  Judgment insight are intact.  Motor examination is upper extremities are normal.  Motor examination is lower extremities are limited somewhat secondary to pain.  He appears to have good dorsiflexion bilaterally.  Plantar flexion is normal.  Quadriceps muscle function and hip flexors are normal bilaterally.  Patient with decrease sensation pinprick and light touch in his right L4 and L5 dermatomes.  Patient with severe pain with straight leg raising.  Reflexes are hypoactive in both lower extremities.  Achilles reflexes are completely absent.  Prior lumbar wound is well-healed.  Patient with significant right-sided L3-4 recurrent disc herniation and radiculopathy and right-sided L4-5 recurrent disc herniation with severe radiculopathy.  I discussed options over management including both operative and nonoperative care.  We discussed the possibility moving forward with a right-sided L3-4 redo decompressive laminotomy and microdiscectomy and right-sided L4-5 decompressive redo laminotomy and microdiscectomy.  I discussed the risks involved with surgery  including but not limited to the risk of anesthesia, bleeding, infection, CSF leak, nerve root injury, discrimination, later spitting pain, disc reherniation, later instability, continued pain, and non-benefit.  The patient has been given the opportunity to ask questions.  He appears to understand.  He wishes to proceed with surgery.

## 2018-05-27 ENCOUNTER — Inpatient Hospital Stay (HOSPITAL_COMMUNITY): Payer: Medicare Other | Admitting: Certified Registered Nurse Anesthetist

## 2018-05-27 ENCOUNTER — Inpatient Hospital Stay (HOSPITAL_COMMUNITY): Payer: Medicare Other

## 2018-05-27 ENCOUNTER — Encounter (HOSPITAL_COMMUNITY)
Admission: EM | Disposition: A | Discharge status: Home Health | Payer: Self-pay | Source: Home / Self Care | Attending: Student in an Organized Health Care Education/Training Program

## 2018-05-27 ENCOUNTER — Encounter (HOSPITAL_COMMUNITY): Payer: Self-pay | Admitting: Certified Registered Nurse Anesthetist

## 2018-05-27 DIAGNOSIS — M5416 Radiculopathy, lumbar region: Secondary | ICD-10-CM | POA: Diagnosis present

## 2018-05-27 HISTORY — PX: LUMBAR LAMINECTOMY/DECOMPRESSION MICRODISCECTOMY: SHX5026

## 2018-05-27 LAB — SURGICAL PCR SCREEN
MRSA, PCR: NEGATIVE
Staphylococcus aureus: NEGATIVE

## 2018-05-27 SURGERY — LUMBAR LAMINECTOMY/DECOMPRESSION MICRODISCECTOMY 2 LEVELS
Anesthesia: General | Site: Back | Laterality: Right

## 2018-05-27 MED ORDER — FENTANYL CITRATE (PF) 250 MCG/5ML IJ SOLN
INTRAMUSCULAR | Status: DC | PRN
  Administered 2018-05-27 (×4): 50 ug via INTRAVENOUS

## 2018-05-27 MED ORDER — HYDROMORPHONE HCL 1 MG/ML IJ SOLN: 1.0000 mg | INTRAMUSCULAR | Status: DC | PRN

## 2018-05-27 MED ORDER — CEFAZOLIN SODIUM-DEXTROSE 2-4 GM/100ML-% IV SOLN: 2.0000 g | INTRAVENOUS | Status: DC

## 2018-05-27 MED ORDER — CEFAZOLIN SODIUM-DEXTROSE 2-4 GM/100ML-% IV SOLN
2.0000 g | INTRAVENOUS | Status: AC
  Administered 2018-05-27: 2 g via INTRAVENOUS

## 2018-05-27 MED ORDER — HYDROCODONE-ACETAMINOPHEN 5-325 MG PO TABS
1.0000 | ORAL_TABLET | ORAL | Status: DC | PRN
  Administered 2018-05-27 – 2018-05-28 (×2): 1 via ORAL
  Filled 2018-05-27 (×2): qty 1

## 2018-05-27 MED ORDER — ACETAMINOPHEN 650 MG RE SUPP: 650.0000 mg | RECTAL | Status: DC | PRN

## 2018-05-27 MED ORDER — HYDROCODONE-ACETAMINOPHEN 10-325 MG PO TABS: 2.0000 | ORAL_TABLET | ORAL | Status: DC | PRN

## 2018-05-27 MED ORDER — ONDANSETRON HCL 4 MG/2ML IJ SOLN: 4.0000 mg | Freq: Four times a day (QID) | INTRAMUSCULAR | Status: DC | PRN

## 2018-05-27 MED ORDER — LIDOCAINE HCL (CARDIAC) PF 100 MG/5ML IV SOSY
PREFILLED_SYRINGE | INTRAVENOUS | Status: DC | PRN
  Administered 2018-05-27: 60 mg via INTRAVENOUS

## 2018-05-27 MED ORDER — CYCLOBENZAPRINE HCL 10 MG PO TABS
10.0000 mg | ORAL_TABLET | Freq: Three times a day (TID) | ORAL | Status: DC | PRN
  Administered 2018-05-28: 10 mg via ORAL
  Filled 2018-05-27: qty 1

## 2018-05-27 MED ORDER — THROMBIN 20000 UNITS EX SOLR
CUTANEOUS | Status: AC
  Filled 2018-05-27: qty 20000

## 2018-05-27 MED ORDER — LACTATED RINGERS IV SOLN
INTRAVENOUS | Status: DC | PRN
  Administered 2018-05-27: 12:00:00 via INTRAVENOUS

## 2018-05-27 MED ORDER — THROMBIN 20000 UNITS EX SOLR
CUTANEOUS | Status: DC | PRN
  Administered 2018-05-27: 13:00:00 via TOPICAL

## 2018-05-27 MED ORDER — ONDANSETRON HCL 4 MG PO TABS: 4.0000 mg | ORAL_TABLET | Freq: Four times a day (QID) | ORAL | Status: DC | PRN

## 2018-05-27 MED ORDER — MIDAZOLAM HCL 2 MG/2ML IJ SOLN
INTRAMUSCULAR | Status: AC
  Filled 2018-05-27: qty 2

## 2018-05-27 MED ORDER — PHENOL 1.4 % MT LIQD: 1.0000 | OROMUCOSAL | Status: DC | PRN

## 2018-05-27 MED ORDER — DEXAMETHASONE SODIUM PHOSPHATE 10 MG/ML IJ SOLN: 10.0000 mg | INTRAMUSCULAR | Status: DC

## 2018-05-27 MED ORDER — BUPIVACAINE HCL (PF) 0.25 % IJ SOLN
INTRAMUSCULAR | Status: DC | PRN
  Administered 2018-05-27: 20 mL

## 2018-05-27 MED ORDER — PROPOFOL 10 MG/ML IV BOLUS
INTRAVENOUS | Status: DC | PRN
  Administered 2018-05-27: 150 mg via INTRAVENOUS

## 2018-05-27 MED ORDER — 0.9 % SODIUM CHLORIDE (POUR BTL) OPTIME
TOPICAL | Status: DC | PRN
  Administered 2018-05-27: 1000 mL

## 2018-05-27 MED ORDER — EPHEDRINE SULFATE 50 MG/ML IJ SOLN
INTRAMUSCULAR | Status: DC | PRN
  Administered 2018-05-27: 10 mg via INTRAVENOUS

## 2018-05-27 MED ORDER — ROCURONIUM BROMIDE 100 MG/10ML IV SOLN
INTRAVENOUS | Status: DC | PRN
  Administered 2018-05-27 (×2): 20 mg via INTRAVENOUS
  Administered 2018-05-27: 50 mg via INTRAVENOUS

## 2018-05-27 MED ORDER — ONDANSETRON HCL 4 MG/2ML IJ SOLN
INTRAMUSCULAR | Status: DC | PRN
  Administered 2018-05-27: 4 mg via INTRAVENOUS

## 2018-05-27 MED ORDER — KETOROLAC TROMETHAMINE 30 MG/ML IJ SOLN
INTRAMUSCULAR | Status: DC | PRN
  Administered 2018-05-27: 30 mg via INTRAVENOUS

## 2018-05-27 MED ORDER — SODIUM CHLORIDE 0.9% FLUSH
3.0000 mL | Freq: Two times a day (BID) | INTRAVENOUS | Status: DC
  Administered 2018-05-27 – 2018-05-28 (×2): 3 mL via INTRAVENOUS

## 2018-05-27 MED ORDER — FENTANYL CITRATE (PF) 100 MCG/2ML IJ SOLN: 25.0000 ug | INTRAMUSCULAR | Status: DC | PRN

## 2018-05-27 MED ORDER — MENTHOL 3 MG MT LOZG: 1.0000 | LOZENGE | OROMUCOSAL | Status: DC | PRN

## 2018-05-27 MED ORDER — SODIUM CHLORIDE 0.9 % IV SOLN
INTRAVENOUS | Status: DC | PRN
  Administered 2018-05-27: 25 ug/min via INTRAVENOUS

## 2018-05-27 MED ORDER — SUGAMMADEX SODIUM 200 MG/2ML IV SOLN
INTRAVENOUS | Status: DC | PRN
  Administered 2018-05-27: 175.4 mg via INTRAVENOUS

## 2018-05-27 MED ORDER — DEXAMETHASONE SODIUM PHOSPHATE 4 MG/ML IJ SOLN
INTRAMUSCULAR | Status: DC | PRN
  Administered 2018-05-27: 5 mg via INTRAVENOUS

## 2018-05-27 MED ORDER — SODIUM CHLORIDE 0.9 % IV SOLN
250.0000 mL | INTRAVENOUS | Status: DC
  Administered 2018-05-27: 250 mL via INTRAVENOUS

## 2018-05-27 MED ORDER — ACETAMINOPHEN 325 MG PO TABS: 650.0000 mg | ORAL_TABLET | ORAL | Status: DC | PRN

## 2018-05-27 MED ORDER — CHLORHEXIDINE GLUCONATE CLOTH 2 % EX PADS
6.0000 | MEDICATED_PAD | Freq: Once | CUTANEOUS | Status: AC
  Administered 2018-05-27: 6 via TOPICAL

## 2018-05-27 MED ORDER — CEFAZOLIN SODIUM-DEXTROSE 1-4 GM/50ML-% IV SOLN
1.0000 g | Freq: Three times a day (TID) | INTRAVENOUS | Status: AC
  Administered 2018-05-27 – 2018-05-28 (×2): 1 g via INTRAVENOUS
  Filled 2018-05-27 (×2): qty 50

## 2018-05-27 MED ORDER — SODIUM CHLORIDE 0.9% FLUSH: 3.0000 mL | INTRAVENOUS | Status: DC | PRN

## 2018-05-27 MED ORDER — PHENYLEPHRINE HCL 10 MG/ML IJ SOLN
INTRAMUSCULAR | Status: DC | PRN
  Administered 2018-05-27: 40 ug via INTRAVENOUS

## 2018-05-27 MED ORDER — FENTANYL CITRATE (PF) 250 MCG/5ML IJ SOLN
INTRAMUSCULAR | Status: AC
  Filled 2018-05-27: qty 5

## 2018-05-27 MED ORDER — KETOROLAC TROMETHAMINE 15 MG/ML IJ SOLN
30.0000 mg | Freq: Four times a day (QID) | INTRAMUSCULAR | Status: AC
  Administered 2018-05-27 – 2018-05-28 (×4): 30 mg via INTRAVENOUS
  Filled 2018-05-27 (×4): qty 2

## 2018-05-27 MED ORDER — CEFAZOLIN SODIUM-DEXTROSE 2-4 GM/100ML-% IV SOLN
INTRAVENOUS | Status: AC
  Filled 2018-05-27: qty 100

## 2018-05-27 MED ORDER — SODIUM CHLORIDE 0.9 % IV SOLN
INTRAVENOUS | Status: DC | PRN
  Administered 2018-05-27: 13:00:00

## 2018-05-27 MED ORDER — BUPIVACAINE HCL (PF) 0.25 % IJ SOLN
INTRAMUSCULAR | Status: AC
  Filled 2018-05-27: qty 30

## 2018-05-27 MED ORDER — DEXAMETHASONE SODIUM PHOSPHATE 10 MG/ML IJ SOLN
INTRAMUSCULAR | Status: AC
  Filled 2018-05-27: qty 1

## 2018-05-27 SURGICAL SUPPLY — 55 items
ADH SKN CLS APL DERMABOND .7 (GAUZE/BANDAGES/DRESSINGS) ×1
APL SKNCLS STERI-STRIP NONHPOA (GAUZE/BANDAGES/DRESSINGS) ×1
BAG DECANTER FOR FLEXI CONT (MISCELLANEOUS) ×2 IMPLANT
BENZOIN TINCTURE PRP APPL 2/3 (GAUZE/BANDAGES/DRESSINGS) ×2 IMPLANT
BLADE CLIPPER SURG (BLADE) ×1 IMPLANT
BUR CUTTER 7.0 ROUND (BURR) ×2 IMPLANT
BUR MATCHSTICK NEURO 3.0 LAGG (BURR) ×1 IMPLANT
CANISTER SUCT 3000ML PPV (MISCELLANEOUS) ×2 IMPLANT
CARTRIDGE OIL MAESTRO DRILL (MISCELLANEOUS) ×1 IMPLANT
COVER WAND RF STERILE (DRAPES) ×1 IMPLANT
DECANTER SPIKE VIAL GLASS SM (MISCELLANEOUS) ×2 IMPLANT
DERMABOND ADVANCED (GAUZE/BANDAGES/DRESSINGS) ×1
DERMABOND ADVANCED .7 DNX12 (GAUZE/BANDAGES/DRESSINGS) ×1 IMPLANT
DIFFUSER DRILL AIR PNEUMATIC (MISCELLANEOUS) ×2 IMPLANT
DRAPE HALF SHEET 40X57 (DRAPES) ×1 IMPLANT
DRAPE LAPAROTOMY 100X72X124 (DRAPES) ×2 IMPLANT
DRAPE MICROSCOPE LEICA (MISCELLANEOUS) ×2 IMPLANT
DRAPE SURG 17X23 STRL (DRAPES) ×4 IMPLANT
DRSG OPSITE POSTOP 4X6 (GAUZE/BANDAGES/DRESSINGS) ×1 IMPLANT
DURAPREP 26ML APPLICATOR (WOUND CARE) ×2 IMPLANT
ELECT REM PT RETURN 9FT ADLT (ELECTROSURGICAL) ×2
ELECTRODE REM PT RTRN 9FT ADLT (ELECTROSURGICAL) ×1 IMPLANT
GAUZE 4X4 16PLY RFD (DISPOSABLE) IMPLANT
GAUZE SPONGE 4X4 12PLY STRL (GAUZE/BANDAGES/DRESSINGS) ×1 IMPLANT
GLOVE BIO SURGEON STRL SZ 6.5 (GLOVE) ×1 IMPLANT
GLOVE BIOGEL PI IND STRL 6.5 (GLOVE) IMPLANT
GLOVE BIOGEL PI IND STRL 7.0 (GLOVE) IMPLANT
GLOVE BIOGEL PI IND STRL 7.5 (GLOVE) IMPLANT
GLOVE BIOGEL PI INDICATOR 6.5 (GLOVE) ×1
GLOVE BIOGEL PI INDICATOR 7.0 (GLOVE) ×3
GLOVE BIOGEL PI INDICATOR 7.5 (GLOVE) ×3
GLOVE ECLIPSE 9.0 STRL (GLOVE) ×2 IMPLANT
GLOVE EXAM NITRILE XL STR (GLOVE) IMPLANT
GOWN STRL REUS W/ TWL LRG LVL3 (GOWN DISPOSABLE) IMPLANT
GOWN STRL REUS W/ TWL XL LVL3 (GOWN DISPOSABLE) ×1 IMPLANT
GOWN STRL REUS W/TWL 2XL LVL3 (GOWN DISPOSABLE) IMPLANT
GOWN STRL REUS W/TWL LRG LVL3 (GOWN DISPOSABLE) ×2
GOWN STRL REUS W/TWL XL LVL3 (GOWN DISPOSABLE) ×6
KIT BASIN OR (CUSTOM PROCEDURE TRAY) ×2 IMPLANT
KIT TURNOVER KIT B (KITS) ×2 IMPLANT
NDL SPNL 22GX3.5 QUINCKE BK (NEEDLE) IMPLANT
NEEDLE HYPO 22GX1.5 SAFETY (NEEDLE) ×2 IMPLANT
NEEDLE SPNL 22GX3.5 QUINCKE BK (NEEDLE) IMPLANT
NS IRRIG 1000ML POUR BTL (IV SOLUTION) ×2 IMPLANT
OIL CARTRIDGE MAESTRO DRILL (MISCELLANEOUS) ×2
PACK LAMINECTOMY NEURO (CUSTOM PROCEDURE TRAY) ×2 IMPLANT
PAD ARMBOARD 7.5X6 YLW CONV (MISCELLANEOUS) ×6 IMPLANT
RUBBERBAND STERILE (MISCELLANEOUS) ×4 IMPLANT
SPONGE SURGIFOAM ABS GEL SZ50 (HEMOSTASIS) ×2 IMPLANT
STRIP CLOSURE SKIN 1/2X4 (GAUZE/BANDAGES/DRESSINGS) ×2 IMPLANT
SUT VIC AB 2-0 CT1 18 (SUTURE) ×2 IMPLANT
SUT VIC AB 3-0 SH 8-18 (SUTURE) ×2 IMPLANT
TOWEL GREEN STERILE (TOWEL DISPOSABLE) ×2 IMPLANT
TOWEL GREEN STERILE FF (TOWEL DISPOSABLE) ×2 IMPLANT
WATER STERILE IRR 1000ML POUR (IV SOLUTION) ×2 IMPLANT

## 2018-05-27 NOTE — Anesthesia Postprocedure Evaluation (Signed)
Anesthesia Post Note  Patient: Bryan Mcgee  Procedure(s) Performed: Right Lumbar three-four, Right Lumbar four-five re-exploration of laminotomies with redo microdiscectomy (Right Back)     Patient location during evaluation: PACU Anesthesia Type: General Level of consciousness: awake Pain management: pain level controlled Vital Signs Assessment: post-procedure vital signs reviewed and stable Cardiovascular status: stable Postop Assessment: no apparent nausea or vomiting Anesthetic complications: no    Last Vitals:  Vitals:   05/27/18 1457 05/27/18 1611  BP:  140/73  Pulse:  63  Resp:  18  Temp: 36.6 C 36.6 C  SpO2:  98%    Last Pain:  Vitals:   05/27/18 1728  TempSrc:   PainSc: 3                  Bobbi Yount

## 2018-05-27 NOTE — Progress Notes (Signed)
Physical Therapy Treatment Patient Details Name: Bryan Mcgee MRN: [id number] DOB: Aug 01, 1945 Today's Date: 05/27/2018    History of Present Illness Pt is a 73 y/o male admitted secondary to ongoing severe back pain. 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 NSG was consulted and recommending surgery. No pertinent PMH in EMR.    PT Comments    Pt presented in bed with wife and daughter present. Pt stated he was waiting on transport to surgery. Pt did not want to attempt EOB d/t pain it caused in previous session. Pt and family educated on basic back precautions and helpful positioning to reduce pain. Pt performed log rolling x 5 ea way with good technique and no increase in pain. Pt tolerated all bed therex in the supine position well with his only increase in pain coming from the hook lying straight leg raise. Pt left with family and call bell/phone within reach and bed alarm on while awaiting transport to surgery. Plan to d/c to HHPT post-op is appropriate at this time based on current status. Plan to progress pt left up to [facility] agency at this time.   Follow Up Recommendations  Home health PT;Supervision/Assistance - 24 hour     Equipment Recommendations  None recommended by PT    Recommendations for Other Services       Precautions / Restrictions Precautions Precautions: Fall;Back Restrictions Weight Bearing Restrictions: No    Mobility  Bed Mobility Overal bed mobility: Needs Assistance Bed Mobility: Rolling Rolling: Supervision         General bed mobility comments: cueing for log roll technique for comfort  Transfers                 General transfer comment: pt deferred stating he did not want to increase his pain prior to surgery.   Ambulation/Gait                  Stairs             Wheelchair Mobility    Modified Rankin (Stroke Patients Only)       Balance                                            Cognition Arousal/Alertness: Awake/alert Behavior During Therapy: WFL for tasks assessed/performed Overall Cognitive Status: Within Functional Limits for tasks assessed                                        Exercises General Exercises - Lower Extremity Ankle Circles/Pumps: AROM;15 reps;Both;Supine Gluteal Sets: AROM;10 reps;Both;Supine(full bridge) Short Arc Quad: Supine;Both;AROM;10 reps(hook lying) Heel Slides: AROM;10 reps;Both;Supine Hip ABduction/ADduction: AROM;10 reps;Both;Supine Straight Leg Raises: AROM;10 reps;Both;Supine(hook lying) Other Exercises Other Exercises: diaphragmatic breathing 1 x 10     General Comments General comments (skin integrity, edema, etc.): unable to assess seated or standing balance at this time      Pertinent Vitals/Pain Faces Pain Scale: Hurts even more Pain Location: lower back Pain Descriptors / Indicators: Aching;Burning;Guarding;Sharp;Shooting;Sore;Throbbing    Home Living  Prior Function            PT Goals (current goals can now be found in the care plan section) Acute Rehab PT Goals Patient Stated Goal: decrease pain PT Goal Formulation: With patient Potential to Achieve Goals: Good    Frequency    Min 3X/week      PT Plan      Co-evaluation              AM-PAC PT "6 Clicks" Mobility   Outcome Measure  Help needed turning from your back to your side while in a flat bed without using bedrails?: None Help needed moving from lying on your back to sitting on the side of a flat bed without using bedrails?: None Help needed moving to and from a bed to a chair (including a wheelchair)?: A Little Help needed standing up from a chair using your arms (e.g., wheelchair or bedside chair)?: A  Little Help needed to walk in hospital room?: A Little Help needed climbing 3-5 steps with a railing? : A Lot 6 Click Score: 19    End of Session   Activity Tolerance: Patient limited by pain Patient left: in bed;with call bell/phone within reach;with bed alarm set Nurse Communication: Mobility status;Patient requests pain meds PT Visit Diagnosis: Other abnormalities of gait and mobility (R26.89);Pain     Time: 1019-1030 PT Time Calculation (min) (ACUTE ONLY): 11 min  Charges:  $Therapeutic Exercise: 8-22 mins                     Margarita Mail, SPTA   Margarita Mail 05/27/2018, 11:48 AM

## 2018-05-27 NOTE — Brief Op Note (Signed)
05/25/2018 - 05/27/2018  1:57 PM  PATIENT:  Bryan Mcgee  73 y.o. male  PRE-OPERATIVE DIAGNOSIS:  Right Lumbar three-four herniated nucleus pulposus and right Lumbar four-five herniated nucleus pulposus with radiculopathy  POST-OPERATIVE DIAGNOSIS:  Right Lumbar three-four herniated nucleus pulposus and right Lumbar four-five herniated nucleus pulposus with radiculopathy  PROCEDURE:  Procedure(s): Right Lumbar three-four, Right Lumbar four-five re-exploration of laminotomies with redo microdiscectomy (Right)  SURGEON:  Surgeon(s) and Role:    Julio Sicks, MD - Primary  PHYSICIAN ASSISTANT:   ASSISTANTSDoran Durand, NP   ANESTHESIA:   general  EBL:  25 mL   BLOOD ADMINISTERED:none  DRAINS: none   LOCAL MEDICATIONS USED:  MARCAINE     SPECIMEN:  No Specimen  DISPOSITION OF SPECIMEN:  N/A  COUNTS:  YES  TOURNIQUET:  * No tourniquets in log *  DICTATION: .Dragon Dictation  PLAN OF CARE: Admit to inpatient   PATIENT DISPOSITION:  PACU - hemodynamically stable.   Delay start of Pharmacological VTE agent (>24hrs) due to surgical blood loss or risk of bleeding: yes

## 2018-05-27 NOTE — Op Note (Signed)
Date of procedure: 05/27/2018  Date of dictation: same  SerSamee: Neurosurgery  Preoperative diagnosis: Right L3-4 recurrent herniated nucleus pulposus with radiculopathy, right L4-5 recurrent herniated nucleus pulposus with radiculopathy  Postoperative diagnosis: Same  Procedure Name: Right L3-4 reexploration of laminotomy with redo microdiscectomy  Right L4-5 reexploration of laminotomy with redo microdiscectomy  Surgeon:Prudence Heiny A.Kieffer Blatz, M.D.  Asst. Surgeon: Doran Durand, NP  Anesthesia: General  Indication: 73 year old male status post prior right-sided L3-4 and right-sided L4-5 laminotomies and microdiscectomies many years ago.  Patient presents now with intractable, severe right lower extremity radicular pain failing conservative management.  Patient has a known small inferiorly migrated disc herniation at L3-4.  He has a new large right-sided L4-5 recurrent disc herniation with severe compression upon the thecal sac and right-sided L5 nerve root.  Patient presents now for redo microdiscectomies at both levels in hopes of improving of symptoms.  Operative note: After induction of anesthesia, patient position prone onto Wilson frame and appropriate padded.  Lumbar region prepped and draped sterilely.  Incision made overlying L4-5.  Dissection performed on the right.  Retractor placed.  X-ray taken.  Levels confirmed.  Previous laminotomy on the right at L4-5 was dissected free using dental instruments.  The laminotomy was widened slightly using high-speed drill and Kerrison Rogers.  Ligament flavum and epidural scar were resected.  Microscope brought in field used micro dissection of the spinal canal.  Thecal sac and right L5 nerve root were identified and gradually mobilized.  Epidural scar was resected.  Thecal sac and right L5 nerve root was gently retracted medially.  Using blunt nerve hooks and large inferiorly migrated disc herniation was dissected free and removed in a number of large  pieces.  The disc space was subsequently entered.  Discectomy was then performed using pituitary Rogers, upward angle pituitary rongeurs and Epstein curettes to remove all loose or obviously degenerative disc material from the interspace.  At this point a very thorough decompression had been achieved.  There was no evidence of injury to thecal sac or nerve roots.  Attention then placed to the L3-4 level.  Previous laminotomy was dissected free once again.  Went laminotomy was widened.  Microscope was used for microdissection of the spinal canal.  Right-sided L4 nerve root was identified and mobilized and retracted since toward the midline.  Inferiorly migrated disc herniation was dissected free and removed and a number of small fragments.  At this point the thecal sac and L4 nerve root were well decompressed.  The disc space itself was explored.  Was found to be completely collapsed and I did not see any value in entering the disc space at this level.  Wound was then irrigated with antibiotic solution.  Gelfoam was placed topically for hemostasis.  Wound is then closed in layers with Vicryl sutures.  Steri-Strips and sterile dressing were applied.  No apparent complications.  Patient tolerated the procedure well and he returns to the recovery room postop.

## 2018-05-27 NOTE — Anesthesia Procedure Notes (Signed)
Procedure Name: Intubation Date/Time: 05/27/2018 12:20 PM Performed by: Glynda Jaeger, CRNA Pre-anesthesia Checklist: Patient identified, Patient being monitored, Timeout performed, Emergency Drugs available and Suction available Patient Re-evaluated:Patient Re-evaluated prior to induction Oxygen Delivery Method: Circle System Utilized Preoxygenation: Pre-oxygenation with 100% oxygen Induction Type: IV induction Ventilation: Mask ventilation without difficulty Laryngoscope Size: Mac and 4 Grade View: Grade I Tube type: Oral Tube size: 7.5 mm Number of attempts: 1 Airway Equipment and Method: Stylet Placement Confirmation: ETT inserted through vocal cords under direct vision,  positive ETCO2 and breath sounds checked- equal and bilateral Secured at: 21 cm Tube secured with: Tape Dental Injury: Teeth and Oropharynx as per pre-operative assessment

## 2018-05-27 NOTE — Care Management Important Message (Signed)
Important Message  Patient Details  Name: Bryan Mcgee MRN: [id number] Date of Birth: 1946/02/10   Medicare Important Message Given:  Yes    Renie Ora 05/27/2018, 1:29 PM

## 2018-05-27 NOTE — Plan of Care (Signed)
  Problem: Safety: Goal: Ability to remain free from injury will improve Outcome: Progressing Note:  Pt NPO for surgery in the afternoon   Problem: Nutrition: Goal: Adequate nutrition will be maintained Outcome: Progressing   Problem: Clinical Measurements: Goal: Ability to maintain clinical measurements within normal limits will improve Outcome: Progressing

## 2018-05-27 NOTE — Anesthesia Preprocedure Evaluation (Addendum)
Anesthesia Evaluation  Patient identified by MRN, date of birth, ID band Patient awake    Reviewed: Allergy & Precautions, NPO status , Patient's Chart, lab work & pertinent test results  Airway Mallampati: II  TM Distance: >3 FB     Dental   Pulmonary neg pulmonary ROS,    breath sounds clear to auscultation       Cardiovascular negative cardio ROS   Rhythm:Regular Rate:Normal     Neuro/Psych History noted. CG    GI/Hepatic negative GI ROS, Neg liver ROS,   Endo/Other  negative endocrine ROS  Renal/GU negative Renal ROS     Musculoskeletal   Abdominal   Peds  Hematology   Anesthesia Other Findings   Reproductive/Obstetrics                             Anesthesia Physical Anesthesia Plan  ASA: III  Anesthesia Plan: General   Post-op Pain Management:    Induction: Intravenous  PONV Risk Score and Plan: Ondansetron, Dexamethasone and Midazolam  Airway Management Planned: Oral ETT  Additional Equipment:   Intra-op Plan:   Post-operative Plan: Possible Post-op intubation/ventilation  Informed Consent: I have reviewed the patients History and Physical, chart, labs and discussed the procedure including the risks, benefits and alternatives for the proposed anesthesia with the patient or authorized representative who has indicated his/her understanding and acceptance.     Dental advisory given  Plan Discussed with: CRNA and Anesthesiologist  Anesthesia Plan Comments:        Anesthesia Quick Evaluation

## 2018-05-27 NOTE — Clinical Social Work Psychosocial (Signed)
Pt  Npo,  PCR surgical screen is negative.

## 2018-05-27 NOTE — Transfer of Care (Signed)
Immediate Anesthesia Transfer of Care Note  Patient: Bryan Mcgee  Procedure(s) Performed: Right Lumbar three-four, Right Lumbar four-five re-exploration of laminotomies with redo microdiscectomy (Right Back)  Patient Location: PACU  Anesthesia Type:General  Level of Consciousness: awake, alert , oriented, patient cooperative and responds to stimulation  Airway & Oxygen Therapy: Patient Spontanous Breathing and Patient connected to face mask oxygen  Post-op Assessment: Report given to RN, Post -op Vital signs reviewed and stable and Patient moving all extremities X 4  Post vital signs: Reviewed and stable  Last Vitals:  Vitals Value Taken Time  BP    Temp    Pulse 73 05/27/2018  2:08 PM  Resp 19 05/27/2018  2:08 PM  SpO2 100 % 05/27/2018  2:08 PM  Vitals shown include unvalidated device data.  Last Pain:  Vitals:   05/27/18 0803  TempSrc: Oral  PainSc: 0-No pain         Complications: No apparent anesthesia complications

## 2018-05-28 ENCOUNTER — Encounter (HOSPITAL_COMMUNITY): Payer: Self-pay | Admitting: Neurosurgery

## 2018-05-28 MED ORDER — CYCLOBENZAPRINE HCL 10 MG PO TABS: 10.0000 mg | ORAL_TABLET | Freq: Three times a day (TID) | ORAL | 0 refills | Status: DC | PRN

## 2018-05-28 MED ORDER — HYDROCODONE-ACETAMINOPHEN 5-325 MG PO TABS: 1.0000 | ORAL_TABLET | ORAL | 0 refills | Status: DC | PRN

## 2018-05-28 NOTE — Discharge Summary (Signed)
Physician Discharge Summary  Patient ID: Bryan Mcgee MRN: [id number] DOB/AGE: 1945-11-13 73 y.o.  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  The patient has a prior history of right-sided L3-4 and L4-5 laminotomies and discectomies.  Work-up demonstrates evidence of a new large right-sided L4-5 disc herniation and a chronic right-sided L34 recurrent disc herniation.  Patient taken to the operating room where he underwent uncomplicated right-sided L3-4 and L4-5 redo laminotomies and microdiscectomy's.  Postoperatively doing very well.  Preoperative back and lower extremity pain resolved.  Able to stand and walk without difficulty.  Ready for discharge home.  Consults:   Significant Diagnostic Studies:   Treatments:   Discharge Exam: Blood pressure 117/72, pulse 72, temperature 97.9 F (36.6 C), temperature source Oral, resp. rate 18, height 6' (1.829 m), weight 87.7 kg, SpO2 99 %. Awake and alert.  Oriented and appropriate.  Cranial nerve function intact.  Motor and sensory function of the extremities intact aside from some very mild right-sided dorsiflexion weakness which is improved from preop.  Sensory examination intact.  Wound clean and dry.  Chest and abdomen benign.  Disposition: Discharge disposition: 01-Home or Self Care       Discharge Instructions    Face-to-face encounter (required for Medicare/Medicaid patients)   Complete by:  As directed    I Temple Pacini certify that this patient is under my care and that I, or a nurse practitioner or physician's assistant working with me, had a  face-to-face encounter that meets the physician face-to-face encounter requirements with this patient on 05/28/2018. The encounter with the patient was in whole, or in part for the following medical condition(s) which is the primary reason for home health care (List medical condition): Lumbar radiculopathy   The encounter with the patient was in whole, or in part, for the following medical condition, which is the primary reason for home health care:  lumbar radiculopathy   I certify that, based on my findings, the following services are medically necessary home health services:  Physical therapy   Reason for Medically Necessary Home Health Services:  Therapy- Home Adaptation to Facilitate Safety   My clinical findings support the need for the above services:  Unable to leave home safely without assistance and/or assistive device   Further, I certify that my clinical findings support that this patient is homebound due to:  Pain interferes with ambulation/mobility   Home Health   Complete by:  As directed    To provide the following care/treatments:   PT OT       Allergies as of 05/28/2018   No Known Allergies     Medication List    TAKE these medications   acetaminophen 500 MG tablet Commonly known as:  TYLENOL Take 1,000 mg by mouth every 6 (six) hours as needed for mild pain.   aspirin EC 81 MG tablet Take 81 mg by mouth daily.   Calcium 600+D Plus Minerals 600-400 MG-UNIT Tabs Take by mouth.   cyclobenzaprine 10 MG tablet Commonly known as:  FLEXERIL Take 1 tablet (10 mg total) by mouth 3 (three) times daily as needed for muscle spasms.  gabapentin 100 MG capsule Commonly known as:  NEURONTIN Take 100 mg by mouth 2 (two) times daily.   Glucosamine-Chondroitin 250-200 MG Caps Take 2 tablets by mouth daily.   HYDROcodone-acetaminophen 5-325 MG tablet Commonly known as:  NORCO/VICODIN Take 1 tablet by mouth every 4 (four) hours as needed for moderate pain ((score 4 to 6)).    Multi-Vitamins Tabs Take by mouth.   naproxen 500 MG tablet Commonly known as:  NAPROSYN Take 500 mg by mouth 2 (two) times daily with a meal.   traMADol 50 MG tablet Commonly known as:  ULTRAM Take 50 mg by mouth every 4 (four) hours as needed.        Signed: Kathaleen Maser Timika Muench 05/28/2018, 9:21 AM

## 2018-05-28 NOTE — Discharge Instructions (Signed)

## 2018-05-28 NOTE — Care Management Note (Addendum)
Case Management Note  Patient Details  Name: Bryan Mcgee MRN: [id number] Date of Birth: 11-25-1945               Action/Plan: 12:28 pm CM talked to patient / spouse about HHC choices, pt chose [facility]; [place] with [facility] called for arrangements; Rolling walker ordered as requested and to be delivered to the room today prior to discharging home. [name] RN,MHA,BSN  1:06 pm - Received call from [place], [facility] is not able to provide staffing where the patient lives; CM talked to pt/ spouse again for [facility] choices; they chose [facility]; [name] with [facility] called, they can accept the patient. Abelino Derrick [facility]   Expected Discharge Date:  05/28/18               Expected Discharge Plan:  Home w Home Health Services  Discharge planning Services  CM Consult  Status of Service:  In process, will continue to follow  Bryan Mcgee [phone] 05/28/2018, 12:28 PM

## 2018-05-28 NOTE — Progress Notes (Signed)
Pt discharge education and instructions completed with pt and spouse at bedside; both voices understanding and denies any questions. Pt IV removed; back incision dsg remains unremarkable and unchanged; pt to pick up electronically sent prescriptions from preferred pharmacy on file. Pt discharge home with spouse to transport him to disposition. Pt home DME walker delivered to pt at bedside. Pt transported off unit via wheelchair with belongings and family to the side. Dionne Bucy RN

## 2018-05-28 NOTE — Evaluation (Signed)
Occupational Therapy Evaluation Patient Details Name: Bryan Mcgee MRN: [id number] DOB: 1945/12/27 Today's Date: 05/28/2018    History of Present Illness Pt is a 73 y/o male admitted secondary to ongoing severe back pain. 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 NSG was consulted and recommending surgery. S/P L 3-4, L4-5 laminotomy with redo microdiscetomy by Dr. Jordan Likes on 05/27/18. No pertinent PMH in EMR.   Clinical Impression   PTA patient independent and driving.  Admitted for above and limited by pain, decreased activity tolerance and impaired balance. Patient able to complete grooming with supervision, UB ADLs with supervision, LB ADLs with supervision and transfers with supervision.  Educated on precautions, safety, ADL compensatory techniques and recommendations.  Patient will have 24/7 support at discharge, agreeable to recommendations to complete ADLs seated, having supervision for mobility/ADLs, and using RW at this time.  Based on performance today, no further OT needs identified and OT signing off.  Thank you for this referral.     Follow Up Recommendations  No OT follow up;Supervision - Intermittent    Equipment Recommendations  None recommended by OT    Recommendations for Other Services       Precautions / Restrictions Precautions Precautions: Fall;Back Precaution Booklet Issued: Yes (comment) Precaution Comments: reviewed precautions with pt and spouse  Required Braces or Orthoses: (no brace needed order) Restrictions Weight Bearing Restrictions: No      Mobility Bed Mobility Overal bed mobility: Needs Assistance Bed Mobility: Rolling;Sidelying to Sit;Sit to Sidelying Rolling: Supervision Sidelying to sit: Supervision     Sit to sidelying: Supervision General bed  mobility comments: cueing for log roll technique  Transfers Overall transfer level: Needs assistance Equipment used: Rolling walker (2 wheeled) Transfers: Sit to/from Stand Sit to Stand: Supervision         General transfer comment: cueing for hand placement and technique, able to complete transfer with no physical assist    Balance Overall balance assessment: Needs assistance Sitting-balance support: No upper extremity supported;Feet supported Sitting balance-Leahy Scale: Good     Standing balance support: No upper extremity supported;During functional activity Standing balance-Leahy Scale: Fair Standing balance comment: reliant on B UE support in standing                            ADL either performed or assessed with clinical judgement   ADL Overall ADL's : Needs assistance/impaired     Grooming: Supervision/safety;Standing   Upper Body Bathing: Supervision/ safety;Sitting   Lower Body Bathing: Supervison/ safety;Sit to/from stand;Cueing for back precautions;Cueing for compensatory techniques Lower Body Bathing Details (indicate cue type and reason): able to complete figure 4 technique with supervision  Upper Body Dressing : Supervision/safety;Set up;Sitting   Lower Body Dressing: Supervision/safety;Set up;Sit to/from stand;Cueing for compensatory techniques;Cueing for back precautions Lower Body Dressing Details (indicate cue type and reason): able to complete figure 4 technique to don clothing  Toilet Transfer: Supervision/safety;Ambulation;RW;Grab bars;Regular Social worker and Hygiene: Modified independent;Sit to/from stand   Tub/ Shower Transfer: Walk-in shower;Supervision/safety;Ambulation;Shower seat;Grab bars   Functional mobility during ADLs: Supervision/safety;Rolling walker General ADL Comments: reviewed precautions, compensatory techniques and safey      Vision         Perception     Praxis  Pertinent  Vitals/Pain Pain Assessment: Faces Faces Pain Scale: Hurts a little bit Pain Location: lower back Pain Descriptors / Indicators: Discomfort;Operative site guarding Pain Intervention(s): Limited activity within patient's tolerance;Repositioned;Monitored during session     Hand Dominance     Extremity/Trunk Assessment Upper Extremity Assessment Upper Extremity Assessment: Overall WFL for tasks assessed   Lower Extremity Assessment Lower Extremity Assessment: Defer to PT evaluation   Cervical / Trunk Assessment Cervical / Trunk Assessment: Other exceptions Cervical / Trunk Exceptions: s/p lumbar surgery   Communication Communication Communication: No difficulties   Cognition Arousal/Alertness: Awake/alert Behavior During Therapy: WFL for tasks assessed/performed Overall Cognitive Status: Within Functional Limits for tasks assessed                                     General Comments  significant other present and supportive    Exercises     Shoulder Instructions      Home Living Family/patient expects to be discharged to:: Private residence Living Arrangements: Children;Spouse/significant other Available Help at Discharge: Family;Available 24 hours/day Type of Home: House Home Access: Ramped entrance     Home Layout: Able to live on main level with bedroom/bathroom     Bathroom Shower/Tub: Arts development officer Toilet: Handicapped height     Home Equipment: Cane - single point;Crutches;Shower seat          Prior Functioning/Environment Level of Independence: Independent                 OT Problem List: Decreased activity tolerance;Impaired balance (sitting and/or standing);Decreased knowledge of precautions;Decreased knowledge of use of DME or AE;Pain      OT Treatment/Interventions:      OT Goals(Current goals can be found in the care plan section) Acute Rehab OT Goals Patient Stated Goal: decrease pain OT Goal Formulation:  With patient  OT Frequency:     Barriers to D/C:            Co-evaluation              AM-PAC OT "6 Clicks" Daily Activity     Outcome Measure Help from another person eating meals?: None Help from another person taking care of personal grooming?: None Help from another person toileting, which includes using toliet, bedpan, or urinal?: None Help from another person bathing (including washing, rinsing, drying)?: None Help from another person to put on and taking off regular upper body clothing?: None Help from another person to put on and taking off regular lower body clothing?: None 6 Click Score: 24   End of Session Equipment Utilized During Treatment: Gait belt;Rolling walker Nurse Communication: Mobility status  Activity Tolerance: Patient tolerated treatment well Patient left: with call bell/phone within reach;with family/visitor present;Other (comment)(seated EOB)  OT Visit Diagnosis: Other abnormalities of gait and mobility (R26.89);Pain Pain - part of body: (back)                Time: 3235-5732 OT Time Calculation (min): 21 min Charges:  OT General Charges $OT Visit: 1 Visit OT Evaluation $OT Eval Moderate Complexity: 1 Mod  Chancy Milroy, OT Acute Rehabilitation Services Pager [phone] Office [phone]   Chancy Milroy 05/28/2018, 11:47 AM

## 2018-08-29 ENCOUNTER — Other Ambulatory Visit: Payer: Self-pay | Admitting: Orthopedic Surgery

## 2018-09-08 NOTE — Patient Instructions (Addendum)
Bryan Mcgee   Your procedure is scheduled on: 09-16-2018  Report to [facility] Main  Entrance               Report to admitting at 1155  AM   YOU NEED TO HAVE A COVID 19 TEST ON_______ @_______ , THIS TEST MUST BE DONE BEFORE SURGERY, COME TO [place]. ONCE YOUR COVID TEST IS COMPLETED, PLEASE BEGIN THE QUARANTINE INSTRUCTIONS AS OUTLINED IN YOUR HANDOUT.   Call this number if you have problems the morning of surgery [phone]    Remember: [place], NO CHEWING GUM CANDY OR MINTS.   NO SOLID FOOD AFTER MIDNIGHT THE NIGHT PRIOR TO SURGERY. NOTHING BY MOUTH EXCEPT CLEAR LIQUIDS UNTIL 1130 AM. PLEASE FINISH ENSURE DRINK PER SURGEON ORDER  WHICH NEEDS TO BE COMPLETED AT 1130 AM.   CLEAR LIQUID DIET   Foods Allowed                                                                     Foods Excluded  Coffee and tea, regular and decaf                             liquids that you cannot  Plain Jell-O in any flavor                                             see through such as: Fruit ices (not with fruit pulp)                                     milk, soups, orange juice  Iced Popsicles                                    All solid food Carbonated beverages, regular and diet                                    Cranberry, grape and apple juices Sports drinks like Gatorade Lightly seasoned clear broth or consume(fat free) Sugar, honey syrup  Sample Menu Breakfast                                Lunch                                     Supper Cranberry juice                    Beef broth  Chicken broth Jell-O                                     Grape juice                           Apple juice Coffee or tea                        Jell-O                                      Popsicle                                                Coffee or tea                         Coffee or tea  _____________________________________________________________________     Take these medicines the morning of surgery with A SIP OF WATER: NONE                                You may not have any metal on your body including hair pins and              piercings  Do not wear jewelry,  lotions, powders or perfumes, deodorant             Do not wear nail polish.  Do not shave  48 hours prior to surgery.              Men may shave face and neck.   Do not bring valuables to the hospital. [facility] IS NOT             RESPONSIBLE   FOR VALUABLES.  Contacts, dentures or bridgework may not be worn into surgery.     ____________________________________________________________________         [facility] - Preparing for Surgery Before surgery, you can play an important role.  Because skin is not sterile, your skin needs to be as free of germs as possible.  You can reduce the number of germs on your skin by washing with CHG (chlorahexidine gluconate) soap before surgery.  CHG is an antiseptic cleaner which kills germs and bonds with the skin to continue killing germs even after washing. Please DO NOT use if you have an allergy to CHG or antibacterial soaps.  If your skin becomes reddened/irritated stop using the CHG and inform your nurse when you arrive at Short Stay. Do not shave (including legs and underarms) for at least 48 hours prior to the first CHG shower.  You may shave your face/neck. Please follow these instructions carefully:  1.  Shower with CHG Soap the night before surgery and the  morning of Surgery.  2.  If you choose to wash your hair, wash your hair first as usual with your  normal  shampoo.  3.  After you shampoo, rinse your hair and body thoroughly to remove the  shampoo.  4.  Use CHG as you would any other liquid soap.  You can apply chg directly  to the skin and wash                       Gently with a scrungie or clean  washcloth.  5.  Apply the CHG Soap to your body ONLY FROM THE NECK DOWN.   Do not use on face/ open                           Wound or open sores. Avoid contact with eyes, ears mouth and genitals (private parts).                       Wash face,  Genitals (private parts) with your normal soap.             6.  Wash thoroughly, paying special attention to the area where your surgery  will be performed.  7.  Thoroughly rinse your body with warm water from the neck down.  8.  DO NOT shower/wash with your normal soap after using and rinsing off  the CHG Soap.                9.  Pat yourself dry with a clean towel.            10.  Wear clean pajamas.            11.  Place clean sheets on your bed the night of your first shower and do not  sleep with pets. Day of Surgery : Do not apply any lotions/deodorants the morning of surgery.  Please wear clean clothes to the hospital/surgery center.  FAILURE TO FOLLOW THESE INSTRUCTIONS MAY RESULT IN THE CANCELLATION OF YOUR SURGERY PATIENT SIGNATURE_________________________________  NURSE SIGNATURE__________________________________  ________________________________________________________________________   Bryan Mcgee  An incentive spirometer is a tool that can help keep your lungs clear and active. This tool measures how well you are filling your lungs with each breath. Taking long deep breaths may help reverse or decrease the chance of developing breathing (pulmonary) problems (especially infection) following:  A long period of time when you are unable to move or be active. BEFORE THE PROCEDURE   If the spirometer includes an indicator to show your best effort, your nurse or respiratory therapist will set it to a desired goal.  If possible, sit up straight or lean slightly forward. Try not to slouch.  Hold the incentive spirometer in an upright position. INSTRUCTIONS FOR USE  1. Sit on the edge of your bed if possible, or sit up as far  as you can in bed or on a chair. 2. Hold the incentive spirometer in an upright position. 3. Breathe out normally. 4. Place the mouthpiece in your mouth and seal your lips tightly around it. 5. Breathe in slowly and as deeply as possible, raising the piston or the ball toward the top of the column. 6. Hold your breath for 3-5 seconds or for as long as possible. Allow the piston or ball to fall to the bottom of the column. 7. Remove the mouthpiece from your mouth and breathe out normally. 8. Rest for a few seconds and repeat Steps 1 through 7 at least 10 times every 1-2 hours when you are awake. Take your time and take a few normal breaths between deep breaths. 9. The spirometer may include an indicator to  show your best effort. Use the indicator as a goal to work toward during each repetition. 10. After each set of 10 deep breaths, practice coughing to be sure your lungs are clear. If you have an incision (the cut made at the time of surgery), support your incision when coughing by placing a pillow or rolled up towels firmly against it. Once you are able to get out of bed, walk around indoors and cough well. You may stop using the incentive spirometer when instructed by your caregiver.  RISKS AND COMPLICATIONS  Take your time so you do not get dizzy or light-headed.  If you are in pain, you may need to take or ask for pain medication before doing incentive spirometry. It is harder to take a deep breath if you are having pain. AFTER USE  Rest and breathe slowly and easily.  It can be helpful to keep track of a log of your progress. Your caregiver can provide you with a simple table to help with this. If you are using the spirometer at home, follow these instructions: [place] IF:   You are having difficultly using the spirometer.  You have trouble using the spirometer as often as instructed.  Your pain medication is not giving enough relief while using the spirometer.  You  develop fever of 100.5 F (38.1 C) or higher. SEEK IMMEDIATE MEDICAL CARE IF:   You cough up bloody sputum that had not been present before.  You develop fever of 102 F (38.9 C) or greater.  You develop worsening pain at or near the incision site. MAKE SURE YOU:   Understand these instructions.  Will watch your condition.  Will get help right away if you are not doing well or get worse. Document Released: 07/20/2006 Document Revised: 06/01/2011 Document Reviewed: 09/20/2006 [employer] Patient Information 2014 [employer], [place].   ________________________________________________________________________  WHAT IS A BLOOD TRANSFUSION? Blood Transfusion Information  A transfusion is the replacement of blood or some of its parts. Blood is made up of multiple cells which provide different functions.  Red blood cells carry oxygen and are used for blood loss replacement.  White blood cells fight against infection.  Platelets control bleeding.  Plasma helps clot blood.  Other blood products are available for specialized needs, such as hemophilia or other clotting disorders. BEFORE THE TRANSFUSION  Who gives blood for transfusions?   Healthy volunteers who are fully evaluated to make sure their blood is safe. This is blood bank blood. Transfusion therapy is the safest it has ever been in the practice of medicine. Before blood is taken from a donor, a complete history is taken to make sure that person has no history of diseases nor engages in risky social behavior (examples are intravenous drug use or sexual activity with multiple partners). The donor's travel history is screened to minimize risk of transmitting infections, such as malaria. The donated blood is tested for signs of infectious diseases, such as HIV and hepatitis. The blood is then tested to be sure it is compatible with you in order to minimize the chance of a transfusion reaction. If you or a relative donates blood, this is  often done in anticipation of surgery and is not appropriate for emergency situations. It takes many days to process the donated blood. RISKS AND COMPLICATIONS Although transfusion therapy is very safe and saves many lives, the main dangers of transfusion include:   Getting an infectious disease.  Developing a transfusion reaction. This is an allergic reaction  to something in the blood you were given. Every precaution is taken to prevent this. The decision to have a blood transfusion has been considered carefully by your caregiver before blood is given. Blood is not given unless the benefits outweigh the risks. AFTER THE TRANSFUSION  Right after receiving a blood transfusion, you will usually feel much better and more energetic. This is especially true if your red blood cells have gotten low (anemic). The transfusion raises the level of the red blood cells which carry oxygen, and this usually causes an energy increase.  The nurse administering the transfusion will monitor you carefully for complications. HOME CARE INSTRUCTIONS  No special instructions are needed after a transfusion. You may find your energy is better. Speak with your caregiver about any limitations on activity for underlying diseases you may have. SEEK MEDICAL CARE IF:   Your condition is not improving after your transfusion.  You develop redness or irritation at the intravenous (IV) site. SEEK IMMEDIATE MEDICAL CARE IF:  Any of the following symptoms occur over the next 12 hours:  Shaking chills.  You have a temperature by mouth above 102 F (38.9 C), not controlled by medicine.  Chest, back, or muscle pain.  People around you feel you are not acting correctly or are confused.  Shortness of breath or difficulty breathing.  Dizziness and fainting.  You get a rash or develop hives.  You have a decrease in urine output.  Your urine turns a dark color or changes to pink, red, or brown. Any of the following  symptoms occur over the next 10 days:  You have a temperature by mouth above 102 F (38.9 C), not controlled by medicine.  Shortness of breath.  Weakness after normal activity.  The white part of the eye turns yellow (jaundice).  You have a decrease in the amount of urine or are urinating less often.  Your urine turns a dark color or changes to pink, red, or brown. Document Released: 03/06/2000 Document Revised: 06/01/2011 Document Reviewed: 10/24/2007 [facility] Patient Information 2014 [place], [place].  _______________________________________________________________________

## 2018-09-09 ENCOUNTER — Encounter (HOSPITAL_COMMUNITY)
Admission: RE | Admit: 2018-09-09 | Discharge: 2018-09-09 | Disposition: A | Discharge status: Home / Self Care | Payer: Medicare Other | Source: Ambulatory Visit | Attending: Orthopedic Surgery | Admitting: Orthopedic Surgery

## 2018-09-09 ENCOUNTER — Other Ambulatory Visit: Payer: Self-pay

## 2018-09-09 ENCOUNTER — Encounter (HOSPITAL_COMMUNITY): Payer: Self-pay

## 2018-09-09 ENCOUNTER — Ambulatory Visit (HOSPITAL_COMMUNITY)
Admission: RE | Admit: 2018-09-09 | Discharge: 2018-09-09 | Disposition: A | Discharge status: Home / Self Care | Payer: Medicare Other | Source: Ambulatory Visit | Attending: Orthopedic Surgery | Admitting: Orthopedic Surgery

## 2018-09-09 DIAGNOSIS — Z01811 Encounter for preprocedural respiratory examination: Secondary | ICD-10-CM

## 2018-09-09 DIAGNOSIS — M1712 Unilateral primary osteoarthritis, left knee: Secondary | ICD-10-CM | POA: Diagnosis not present

## 2018-09-09 HISTORY — DX: Unspecified osteoarthritis, unspecified site: M19.90

## 2018-09-09 LAB — URINALYSIS, ROUTINE W REFLEX MICROSCOPIC
Bilirubin Urine: NEGATIVE
Glucose, UA: NEGATIVE mg/dL
Hgb urine dipstick: NEGATIVE
Ketones, ur: NEGATIVE mg/dL
Leukocytes,Ua: NEGATIVE
Nitrite: NEGATIVE
Protein, ur: NEGATIVE mg/dL
Specific Gravity, Urine: 1.008 (ref 1.005–1.030)
pH: 6 (ref 5.0–8.0)

## 2018-09-09 LAB — PROTIME-INR
INR: 1 (ref 0.8–1.2)
Prothrombin Time: 12.6 seconds (ref 11.4–15.2)

## 2018-09-09 LAB — SURGICAL PCR SCREEN
MRSA, PCR: NEGATIVE
Staphylococcus aureus: NEGATIVE

## 2018-09-09 LAB — APTT: aPTT: 30 seconds (ref 24–36)

## 2018-09-09 NOTE — Progress Notes (Addendum)
Cbc/diff on chart , ekg 09-05-18 on chart.  bmb on chart from 09-05-18

## 2018-09-09 NOTE — Progress Notes (Signed)
Cbc/diff , cmp, ekg done 09-05-18 requested 09-09-18

## 2018-09-10 LAB — ABO/RH: ABO/RH(D): A POS

## 2018-09-13 ENCOUNTER — Other Ambulatory Visit: Payer: Self-pay | Admitting: Orthopedic Surgery

## 2018-09-13 ENCOUNTER — Other Ambulatory Visit (HOSPITAL_COMMUNITY)
Admission: RE | Admit: 2018-09-13 | Discharge: 2018-09-13 | Disposition: A | Discharge status: Home / Self Care | Payer: Medicare Other | Source: Ambulatory Visit | Attending: Orthopedic Surgery | Admitting: Orthopedic Surgery

## 2018-09-13 DIAGNOSIS — Z1159 Encounter for screening for other viral diseases: Secondary | ICD-10-CM | POA: Diagnosis present

## 2018-09-13 LAB — SARS CORONAVIRUS 2 (TAT 6-24 HRS): SARS Coronavirus 2: NEGATIVE

## 2018-09-13 NOTE — Care Plan (Signed)
Spoke with patient prior to surgery. Will discharge to home with family and HHPT. Has walker. CPM ordered. He will transition to [place] following MD appointment   Questions answered. Patient and physician in agreement with plan. Choice offered.   Ladell Heads, [place]

## 2018-09-15 MED ORDER — BUPIVACAINE LIPOSOME 1.3 % IJ SUSP
20.0000 mL | Freq: Once | INTRAMUSCULAR | Status: DC
  Filled 2018-09-15: qty 20

## 2018-09-16 ENCOUNTER — Observation Stay (HOSPITAL_COMMUNITY)
Admission: RE | Admit: 2018-09-16 | Discharge: 2018-09-17 | Disposition: A | Discharge status: Home Health | Payer: Medicare Other | Attending: Orthopedic Surgery | Admitting: Orthopedic Surgery

## 2018-09-16 ENCOUNTER — Other Ambulatory Visit: Payer: Self-pay

## 2018-09-16 ENCOUNTER — Encounter (HOSPITAL_COMMUNITY): Payer: Self-pay | Admitting: *Deleted

## 2018-09-16 ENCOUNTER — Encounter (HOSPITAL_COMMUNITY)
Admission: RE | Disposition: A | Discharge status: Home Health | Payer: Self-pay | Source: Home / Self Care | Attending: Orthopedic Surgery

## 2018-09-16 ENCOUNTER — Ambulatory Visit (HOSPITAL_COMMUNITY): Payer: Medicare Other | Admitting: Registered Nurse

## 2018-09-16 ENCOUNTER — Ambulatory Visit (HOSPITAL_COMMUNITY): Payer: Medicare Other | Admitting: Physician Assistant

## 2018-09-16 DIAGNOSIS — M1712 Unilateral primary osteoarthritis, left knee: Principal | ICD-10-CM | POA: Diagnosis present

## 2018-09-16 DIAGNOSIS — Z7982 Long term (current) use of aspirin: Secondary | ICD-10-CM | POA: Diagnosis not present

## 2018-09-16 DIAGNOSIS — Z791 Long term (current) use of non-steroidal anti-inflammatories (NSAID): Secondary | ICD-10-CM | POA: Insufficient documentation

## 2018-09-16 HISTORY — PX: TOTAL KNEE ARTHROPLASTY: SHX125

## 2018-09-16 LAB — TYPE AND SCREEN
ABO/RH(D): A POS
Antibody Screen: NEGATIVE

## 2018-09-16 SURGERY — ARTHROPLASTY, KNEE, TOTAL
Anesthesia: Regional | Laterality: Left

## 2018-09-16 MED ORDER — ONDANSETRON HCL 4 MG PO TABS: 4.0000 mg | ORAL_TABLET | Freq: Four times a day (QID) | ORAL | Status: DC | PRN

## 2018-09-16 MED ORDER — SODIUM CHLORIDE 0.9 % IV SOLN
INTRAVENOUS | Status: DC
  Administered 2018-09-16 – 2018-09-17 (×2): via INTRAVENOUS

## 2018-09-16 MED ORDER — DOCUSATE SODIUM 100 MG PO CAPS: 100.0000 mg | ORAL_CAPSULE | Freq: Two times a day (BID) | ORAL | 0 refills | Status: AC

## 2018-09-16 MED ORDER — FENTANYL CITRATE (PF) 100 MCG/2ML IJ SOLN
INTRAMUSCULAR | Status: AC
  Filled 2018-09-16: qty 2

## 2018-09-16 MED ORDER — METHOCARBAMOL 500 MG IVPB - SIMPLE MED
500.0000 mg | Freq: Four times a day (QID) | INTRAVENOUS | Status: DC | PRN
  Administered 2018-09-16: 500 mg via INTRAVENOUS
  Filled 2018-09-16 (×3): qty 50

## 2018-09-16 MED ORDER — HYDROMORPHONE HCL 1 MG/ML IJ SOLN: 0.5000 mg | INTRAMUSCULAR | Status: DC | PRN

## 2018-09-16 MED ORDER — DEXAMETHASONE SODIUM PHOSPHATE 10 MG/ML IJ SOLN
10.0000 mg | Freq: Two times a day (BID) | INTRAMUSCULAR | Status: DC
  Administered 2018-09-16 – 2018-09-17 (×2): 10 mg via INTRAVENOUS
  Filled 2018-09-16 (×2): qty 1

## 2018-09-16 MED ORDER — ACETAMINOPHEN 500 MG PO TABS
ORAL_TABLET | ORAL | Status: AC
  Administered 2018-09-16: 1000 mg
  Filled 2018-09-16: qty 2

## 2018-09-16 MED ORDER — LACTATED RINGERS IV SOLN
INTRAVENOUS | Status: DC
  Administered 2018-09-16 (×3): via INTRAVENOUS

## 2018-09-16 MED ORDER — CELECOXIB 200 MG PO CAPS
200.0000 mg | ORAL_CAPSULE | Freq: Two times a day (BID) | ORAL | Status: DC
  Administered 2018-09-16 – 2018-09-17 (×2): 200 mg via ORAL
  Filled 2018-09-16 (×2): qty 1

## 2018-09-16 MED ORDER — LIDOCAINE HCL (CARDIAC) PF 100 MG/5ML IV SOSY
PREFILLED_SYRINGE | INTRAVENOUS | Status: DC | PRN
  Administered 2018-09-16: 100 mg via INTRAVENOUS

## 2018-09-16 MED ORDER — METHOCARBAMOL 500 MG IVPB - SIMPLE MED
INTRAVENOUS | Status: AC
  Filled 2018-09-16: qty 50

## 2018-09-16 MED ORDER — ASPIRIN EC 325 MG PO TBEC
325.0000 mg | DELAYED_RELEASE_TABLET | Freq: Two times a day (BID) | ORAL | Status: DC
  Administered 2018-09-17: 325 mg via ORAL
  Filled 2018-09-16: qty 1

## 2018-09-16 MED ORDER — FENTANYL CITRATE (PF) 100 MCG/2ML IJ SOLN
25.0000 ug | INTRAMUSCULAR | Status: DC | PRN
  Administered 2018-09-16: 25 ug via INTRAVENOUS
  Administered 2018-09-16: 50 ug via INTRAVENOUS
  Administered 2018-09-16: 25 ug via INTRAVENOUS

## 2018-09-16 MED ORDER — CLONIDINE HCL (ANALGESIA) 100 MCG/ML EP SOLN
EPIDURAL | Status: DC | PRN
  Administered 2018-09-16: 50 ug

## 2018-09-16 MED ORDER — BUPIVACAINE-EPINEPHRINE (PF) 0.25% -1:200000 IJ SOLN
INTRAMUSCULAR | Status: DC | PRN
  Administered 2018-09-16: 30 mL

## 2018-09-16 MED ORDER — ONDANSETRON HCL 4 MG/2ML IJ SOLN
INTRAMUSCULAR | Status: DC | PRN
  Administered 2018-09-16: 4 mg via INTRAVENOUS

## 2018-09-16 MED ORDER — ASPIRIN EC 325 MG PO TBEC: 325.0000 mg | DELAYED_RELEASE_TABLET | Freq: Two times a day (BID) | ORAL | 0 refills | Status: AC

## 2018-09-16 MED ORDER — ONDANSETRON HCL 4 MG/2ML IJ SOLN: 4.0000 mg | Freq: Four times a day (QID) | INTRAMUSCULAR | Status: DC | PRN

## 2018-09-16 MED ORDER — PROPOFOL 10 MG/ML IV BOLUS
INTRAVENOUS | Status: AC
  Filled 2018-09-16: qty 60

## 2018-09-16 MED ORDER — TRANEXAMIC ACID-NACL 1000-0.7 MG/100ML-% IV SOLN
1000.0000 mg | Freq: Once | INTRAVENOUS | Status: AC
  Administered 2018-09-16: 1000 mg via INTRAVENOUS
  Filled 2018-09-16: qty 100

## 2018-09-16 MED ORDER — ACETAMINOPHEN 325 MG PO TABS: 325.0000 mg | ORAL_TABLET | Freq: Four times a day (QID) | ORAL | Status: DC | PRN

## 2018-09-16 MED ORDER — SODIUM CHLORIDE 0.9% FLUSH
INTRAVENOUS | Status: DC | PRN
  Administered 2018-09-16: 50 mL

## 2018-09-16 MED ORDER — EPHEDRINE SULFATE 50 MG/ML IJ SOLN
INTRAMUSCULAR | Status: DC | PRN
  Administered 2018-09-16 (×4): 10 mg via INTRAVENOUS

## 2018-09-16 MED ORDER — ROCURONIUM BROMIDE 100 MG/10ML IV SOLN
INTRAVENOUS | Status: DC | PRN
  Administered 2018-09-16: 60 mg via INTRAVENOUS

## 2018-09-16 MED ORDER — POLYETHYLENE GLYCOL 3350 17 G PO PACK: 17.0000 g | PACK | Freq: Every day | ORAL | Status: DC | PRN

## 2018-09-16 MED ORDER — BUPIVACAINE-EPINEPHRINE (PF) 0.5% -1:200000 IJ SOLN
INTRAMUSCULAR | Status: DC | PRN
  Administered 2018-09-16: 20 mL via PERINEURAL

## 2018-09-16 MED ORDER — OXYCODONE HCL 5 MG PO TABS
5.0000 mg | ORAL_TABLET | ORAL | Status: DC | PRN
  Administered 2018-09-16: 5 mg via ORAL
  Administered 2018-09-17 (×2): 10 mg via ORAL
  Filled 2018-09-16: qty 2
  Filled 2018-09-16: qty 1
  Filled 2018-09-16: qty 2

## 2018-09-16 MED ORDER — GABAPENTIN 300 MG PO CAPS
300.0000 mg | ORAL_CAPSULE | Freq: Two times a day (BID) | ORAL | Status: DC
  Administered 2018-09-16 – 2018-09-17 (×2): 300 mg via ORAL
  Filled 2018-09-16 (×2): qty 1

## 2018-09-16 MED ORDER — DEXAMETHASONE SODIUM PHOSPHATE 10 MG/ML IJ SOLN
INTRAMUSCULAR | Status: AC
  Filled 2018-09-16: qty 1

## 2018-09-16 MED ORDER — MIDAZOLAM HCL 2 MG/2ML IJ SOLN
1.0000 mg | INTRAMUSCULAR | Status: DC
  Administered 2018-09-16: 13:00:00 2 mg via INTRAVENOUS

## 2018-09-16 MED ORDER — ONDANSETRON HCL 4 MG/2ML IJ SOLN
INTRAMUSCULAR | Status: AC
  Filled 2018-09-16: qty 2

## 2018-09-16 MED ORDER — SODIUM CHLORIDE 0.9 % IR SOLN
Status: DC | PRN
  Administered 2018-09-16: 3000 mL

## 2018-09-16 MED ORDER — POVIDONE-IODINE 10 % EX SWAB
2.0000 "application " | Freq: Once | CUTANEOUS | Status: AC
  Administered 2018-09-16: 2 via TOPICAL

## 2018-09-16 MED ORDER — SODIUM CHLORIDE (PF) 0.9 % IJ SOLN
INTRAMUSCULAR | Status: AC
  Filled 2018-09-16: qty 50

## 2018-09-16 MED ORDER — DIPHENHYDRAMINE HCL 12.5 MG/5ML PO ELIX
12.5000 mg | ORAL_SOLUTION | ORAL | Status: DC | PRN
  Administered 2018-09-17: 25 mg via ORAL
  Filled 2018-09-16: qty 10

## 2018-09-16 MED ORDER — BUPIVACAINE LIPOSOME 1.3 % IJ SUSP
INTRAMUSCULAR | Status: DC | PRN
  Administered 2018-09-16: 20 mL

## 2018-09-16 MED ORDER — CEFAZOLIN SODIUM-DEXTROSE 2-4 GM/100ML-% IV SOLN
2.0000 g | Freq: Four times a day (QID) | INTRAVENOUS | Status: AC
  Administered 2018-09-16 – 2018-09-17 (×2): 2 g via INTRAVENOUS
  Filled 2018-09-16 (×2): qty 100

## 2018-09-16 MED ORDER — METHOCARBAMOL 500 MG PO TABS
500.0000 mg | ORAL_TABLET | Freq: Four times a day (QID) | ORAL | Status: DC | PRN
  Administered 2018-09-17 (×2): 500 mg via ORAL
  Filled 2018-09-16 (×3): qty 1

## 2018-09-16 MED ORDER — OXYCODONE-ACETAMINOPHEN 5-325 MG PO TABS: 1.0000 | ORAL_TABLET | Freq: Four times a day (QID) | ORAL | 0 refills | Status: AC | PRN

## 2018-09-16 MED ORDER — BISACODYL 5 MG PO TBEC: 5.0000 mg | DELAYED_RELEASE_TABLET | Freq: Every day | ORAL | Status: DC | PRN

## 2018-09-16 MED ORDER — TIZANIDINE HCL 2 MG PO TABS: 2.0000 mg | ORAL_TABLET | Freq: Three times a day (TID) | ORAL | 0 refills | Status: AC | PRN

## 2018-09-16 MED ORDER — TRANEXAMIC ACID-NACL 1000-0.7 MG/100ML-% IV SOLN
1000.0000 mg | INTRAVENOUS | Status: AC
  Administered 2018-09-16: 15:00:00 1000 mg via INTRAVENOUS
  Filled 2018-09-16: qty 100

## 2018-09-16 MED ORDER — MIDAZOLAM HCL 5 MG/5ML IJ SOLN
INTRAMUSCULAR | Status: DC | PRN
  Administered 2018-09-16: 1 mg via INTRAVENOUS

## 2018-09-16 MED ORDER — SUGAMMADEX SODIUM 200 MG/2ML IV SOLN
INTRAVENOUS | Status: DC | PRN
  Administered 2018-09-16: 200 mg via INTRAVENOUS

## 2018-09-16 MED ORDER — 0.9 % SODIUM CHLORIDE (POUR BTL) OPTIME
TOPICAL | Status: DC | PRN
  Administered 2018-09-16: 1000 mL

## 2018-09-16 MED ORDER — CHLORHEXIDINE GLUCONATE 4 % EX LIQD: 60.0000 mL | Freq: Once | CUTANEOUS | Status: DC

## 2018-09-16 MED ORDER — BUPIVACAINE-EPINEPHRINE (PF) 0.25% -1:200000 IJ SOLN
INTRAMUSCULAR | Status: AC
  Filled 2018-09-16: qty 30

## 2018-09-16 MED ORDER — MAGNESIUM CITRATE PO SOLN: 1.0000 | Freq: Once | ORAL | Status: DC | PRN

## 2018-09-16 MED ORDER — DOCUSATE SODIUM 100 MG PO CAPS
100.0000 mg | ORAL_CAPSULE | Freq: Two times a day (BID) | ORAL | Status: DC
  Administered 2018-09-16 – 2018-09-17 (×2): 100 mg via ORAL
  Filled 2018-09-16 (×2): qty 1

## 2018-09-16 MED ORDER — FENTANYL CITRATE (PF) 100 MCG/2ML IJ SOLN
INTRAMUSCULAR | Status: AC
  Administered 2018-09-16: 50 ug via INTRAVENOUS
  Filled 2018-09-16: qty 2

## 2018-09-16 MED ORDER — MIDAZOLAM HCL 2 MG/2ML IJ SOLN
INTRAMUSCULAR | Status: AC
  Filled 2018-09-16: qty 2

## 2018-09-16 MED ORDER — MIDAZOLAM HCL 2 MG/2ML IJ SOLN
INTRAMUSCULAR | Status: AC
  Administered 2018-09-16: 2 mg via INTRAVENOUS
  Filled 2018-09-16: qty 2

## 2018-09-16 MED ORDER — ALUM & MAG HYDROXIDE-SIMETH 200-200-20 MG/5ML PO SUSP: 30.0000 mL | ORAL | Status: DC | PRN

## 2018-09-16 MED ORDER — WATER FOR IRRIGATION, STERILE IR SOLN
Status: DC | PRN
  Administered 2018-09-16: 2000 mL

## 2018-09-16 MED ORDER — FENTANYL CITRATE (PF) 100 MCG/2ML IJ SOLN
50.0000 ug | INTRAMUSCULAR | Status: DC
  Administered 2018-09-16: 15:00:00 100 ug via INTRAVENOUS
  Administered 2018-09-16 (×6): 50 ug via INTRAVENOUS

## 2018-09-16 MED ORDER — PROPOFOL 10 MG/ML IV BOLUS
INTRAVENOUS | Status: DC | PRN
  Administered 2018-09-16: 50 mg via INTRAVENOUS
  Administered 2018-09-16: 150 mg via INTRAVENOUS

## 2018-09-16 MED ORDER — CEFAZOLIN SODIUM-DEXTROSE 2-4 GM/100ML-% IV SOLN
2.0000 g | INTRAVENOUS | Status: AC
  Administered 2018-09-16: 15:00:00 2 g via INTRAVENOUS
  Filled 2018-09-16: qty 100

## 2018-09-16 SURGICAL SUPPLY — 59 items
APL SKNCLS STERI-STRIP NONHPOA (GAUZE/BANDAGES/DRESSINGS) ×1
ATTUNE MED DOME PAT 41 KNEE (Knees) ×1 IMPLANT
ATTUNE MED DOME PAT 41MM KNEE (Knees) ×1 IMPLANT
ATTUNE PS FEM LT SZ 8 CEM KNEE (Femur) ×2 IMPLANT
ATTUNE PSRP INSR SZ8 5 KNEE (Insert) ×1 IMPLANT
ATTUNE PSRP INSR SZ8 5MM KNEE (Insert) ×1 IMPLANT
BAG SPEC THK2 15X12 ZIP CLS (MISCELLANEOUS) ×1
BAG ZIPLOCK 12X15 (MISCELLANEOUS) ×3 IMPLANT
BANDAGE ACE 6X5 VEL STRL LF (GAUZE/BANDAGES/DRESSINGS) ×5 IMPLANT
BASE TIBIAL ROT PLAT SZ 8 KNEE (Knees) IMPLANT
BENZOIN TINCTURE PRP APPL 2/3 (GAUZE/BANDAGES/DRESSINGS) ×3 IMPLANT
BLADE SAGITTAL 25.0X1.19X90 (BLADE) ×2 IMPLANT
BLADE SAGITTAL 25.0X1.19X90MM (BLADE) ×1
BLADE SAW SGTL 11.0X1.19X90.0M (BLADE) ×3 IMPLANT
BLADE SURG SZ10 CARB STEEL (BLADE) ×6 IMPLANT
BOOTIES KNEE HIGH SLOAN (MISCELLANEOUS) ×3 IMPLANT
BOWL SMART MIX CTS (DISPOSABLE) ×3 IMPLANT
BSPLAT TIB 8 CMNT ROT PLAT STR (Knees) ×1 IMPLANT
CEMENT HV SMART SET (Cement) ×6 IMPLANT
CLOSURE STERI-STRIP 1/2X4 (GAUZE/BANDAGES/DRESSINGS) ×1
CLOSURE WOUND 1/2 X4 (GAUZE/BANDAGES/DRESSINGS)
CLSR STERI-STRIP ANTIMIC 1/2X4 (GAUZE/BANDAGES/DRESSINGS) ×1 IMPLANT
COVER SURGICAL LIGHT HANDLE (MISCELLANEOUS) ×3 IMPLANT
COVER WAND RF STERILE (DRAPES) IMPLANT
CUFF TOURN SGL QUICK 34 (TOURNIQUET CUFF) ×3
CUFF TRNQT CYL 34X4.125X (TOURNIQUET CUFF) ×1 IMPLANT
DECANTER SPIKE VIAL GLASS SM (MISCELLANEOUS) ×2 IMPLANT
DRAPE U-SHAPE 47X51 STRL (DRAPES) ×3 IMPLANT
DRSG AQUACEL AG ADV 3.5X10 (GAUZE/BANDAGES/DRESSINGS) ×3 IMPLANT
DURAPREP 26ML APPLICATOR (WOUND CARE) ×3 IMPLANT
ELECT REM PT RETURN 15FT ADLT (MISCELLANEOUS) ×3 IMPLANT
GLOVE BIOGEL PI IND STRL 8 (GLOVE) ×2 IMPLANT
GLOVE BIOGEL PI INDICATOR 8 (GLOVE) ×4
GLOVE ECLIPSE 7.5 STRL STRAW (GLOVE) ×6 IMPLANT
GOWN STRL REUS W/TWL XL LVL3 (GOWN DISPOSABLE) ×6 IMPLANT
HANDPIECE INTERPULSE COAX TIP (DISPOSABLE) ×3
HOLDER FOLEY CATH W/STRAP (MISCELLANEOUS) IMPLANT
HOOD PEEL AWAY FLYTE STAYCOOL (MISCELLANEOUS) ×9 IMPLANT
KIT TURNOVER KIT A (KITS) IMPLANT
MANIFOLD NEPTUNE II (INSTRUMENTS) ×3 IMPLANT
NEEDLE HYPO 22GX1.5 SAFETY (NEEDLE) ×3 IMPLANT
NS IRRIG 1000ML POUR BTL (IV SOLUTION) ×3 IMPLANT
PACK ICE MAXI GEL EZY WRAP (MISCELLANEOUS) ×3 IMPLANT
PACK TOTAL KNEE CUSTOM (KITS) ×3 IMPLANT
PADDING CAST COTTON 6X4 STRL (CAST SUPPLIES) ×3 IMPLANT
PIN STEINMAN FIXATION KNEE (PIN) ×2 IMPLANT
PIN THREADED HEADED SIGMA (PIN) ×2 IMPLANT
PROTECTOR NERVE ULNAR (MISCELLANEOUS) ×3 IMPLANT
SET HNDPC FAN SPRY TIP SCT (DISPOSABLE) ×1 IMPLANT
STRIP CLOSURE SKIN 1/2X4 (GAUZE/BANDAGES/DRESSINGS) IMPLANT
SUT MNCRL AB 3-0 PS2 18 (SUTURE) ×3 IMPLANT
SUT VIC AB 0 CT1 36 (SUTURE) ×3 IMPLANT
SUT VIC AB 1 CT1 36 (SUTURE) ×6 IMPLANT
SYR CONTROL 10ML LL (SYRINGE) ×6 IMPLANT
TIBIAL BASE ROT PLAT SZ 8 KNEE (Knees) ×3 IMPLANT
TRAY FOLEY MTR SLVR 16FR STAT (SET/KITS/TRAYS/PACK) ×1 IMPLANT
WATER STERILE IRR 1000ML POUR (IV SOLUTION) ×6 IMPLANT
WRAP KNEE MAXI GEL POST OP (GAUZE/BANDAGES/DRESSINGS) ×2 IMPLANT
YANKAUER SUCT BULB TIP 10FT TU (MISCELLANEOUS) ×3 IMPLANT

## 2018-09-16 NOTE — Brief Op Note (Signed)
09/16/2018  4:18 PM  PATIENT:  Bryan Mcgee  73 y.o. male  PRE-OPERATIVE DIAGNOSIS:  END STAGE DEGENERATIVE JOINT DISEASE, LEFT KNEE  POST-OPERATIVE DIAGNOSIS:  END STAGE DEGENERATIVE JOINT DISEASE, LEFT KNEE  PROCEDURE:  Procedure(s): TOTAL KNEE ARTHROPLASTY (Left)  SURGEON:  Surgeon(s) and Role:    Dorna Leitz, MD - Primary  PHYSICIAN ASSISTANT:   ASSISTANTS: jim bethune   ANESTHESIA:   general  EBL:  300 mL   BLOOD ADMINISTERED:none  DRAINS: none   LOCAL MEDICATIONS USED:  MARCAINE    and OTHER experel  SPECIMEN:  No Specimen  DISPOSITION OF SPECIMEN:  N/A  COUNTS:  YES  TOURNIQUET:  * Missing tourniquet times found for documented tourniquets in log: [id number] *  DICTATION: .Other Dictation: Dictation Number [id number]  PLAN OF CARE: Admit for overnight observation  PATIENT DISPOSITION:  PACU - hemodynamically stable.   Delay start of Pharmacological VTE agent (>24hrs) due to surgical blood loss or risk of bleeding: no

## 2018-09-16 NOTE — H&P (Signed)
TOTAL KNEE ADMISSION H&P  Patient is being admitted for left total knee arthroplasty.  Subjective:  Chief Complaint:left knee pain.  HPI: Bryan Mcgee, 73 y.o. male, has a history of pain and functional disability in the left knee due to arthritis and has failed non-surgical conservative treatments for greater than 12 weeks to includeNSAID's and/or analgesics, corticosteriod injections, viscosupplementation injections, use of assistive devices and activity modification.  Onset of symptoms was gradual, starting 3 years ago with gradually worsening course since that time. The patient noted no past surgery on the left knee(s).  Patient currently rates pain in the left knee(s) at 8 out of 10 with activity. Patient has night pain, worsening of pain with activity and weight bearing, pain that interferes with activities of daily living and joint swelling.  Patient has evidence of subchondral sclerosis, periarticular osteophytes and joint space narrowing by imaging studies. This patient has had failure of reasonable conservatinvr vcare. There is no active infection.  Patient Active Problem List   Diagnosis Date Noted  . Lumbar radiculopathy 05/27/2018  . Constipation 05/26/2018  . Radicular pain of right lower extremity 05/26/2018  . 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 Chronic bilateral low back pain without sciatica 06/09/2016  . Pure hypercholesterolemia 01/09/2016  . Benign prostatic hyperplasia 01/20/2013   Past Medical History:  Diagnosis Date  . Arthritis     Past Surgical History:  Procedure Laterality Date  . BACK SURGERY     x 3 last one march  2020  . LUMBAR LAMINECTOMY/DECOMPRESSION MICRODISCECTOMY Right 05/27/2018   Procedure: Right Lumbar three-four, Right Lumbar four-five re-exploration of laminotomies with redo microdiscectomy;  Surgeon: Julio SicksPool, Henry, MD;  [facility];  Service: Neurosurgery;  Laterality: Right;    Current  Facility-Administered Medications  Medication Dose Route Frequency Provider Last Rate Last Dose  . bupivacaine liposome (EXPAREL) 1.3 % injection 266 mg  20 mL Other Once Jodi GeraldsGraves, Manases Etchison, MD       Current Outpatient Medications  Medication Sig Dispense Refill Last Dose  . aspirin EC 81 MG tablet Take 81 mg by mouth daily.      . Calcium Carbonate-Vit D-Min (CALCIUM 600+D PLUS MINERALS) 600-400 MG-UNIT TABS Take 1 tablet by mouth daily.      . Glucosamine-Chondroitin 250-200 MG CAPS Take 2 tablets by mouth daily.      . Multiple Vitamin (MULTI-VITAMINS) TABS Take 1 tablet by mouth daily.      . naproxen (NAPROSYN) 500 MG tablet Take 500 mg by mouth daily.      . cyclobenzaprine (FLEXERIL) 10 MG tablet Take 1 tablet (10 mg total) by mouth 3 (three) times daily as needed for muscle spasms. (Patient not taking: Reported on 09/07/2018) 30 tablet 0 Not Taking at Unknown time  . HYDROcodone-acetaminophen (NORCO/VICODIN) 5-325 MG tablet Take 1 tablet by mouth every 4 (four) hours as needed for moderate pain ((score 4 to 6)). (Patient not taking: Reported on 09/07/2018) 30 tablet 0 Not Taking at Unknown time   No Known Allergies  Social History   Tobacco Use  . Smoking status: Never Smoker  . Smokeless tobacco: Never Used  Substance Use Topics  . Alcohol use: Never    Frequency: Never    Family History  Problem Relation Age of Onset  . Melanoma Mother   . Subarachnoid hemorrhage Father   . Heart failure Father      ROS ROS: I have reviewed the patient's review of systems thoroughly and there are  no positive responses as relates to the HPI. Objective:  Physical Exam  Vital signs in last 24 hours:   Well-developed well-nourished patient in no acute distress. Alert and oriented x3 HEENT:within normal limits Cardiac: Regular rate and rhythm Pulmonary: Lungs clear to auscultation Abdomen: Soft and nontender.  Normal active bowel sounds  Musculoskeletal: (Left knee: Painful range of motion.   Limited range of motion.  No instability.  Trace effusion. Labs: Recent Results (from the past 2160 hour(s))  ABO/Rh     Status: None   Collection Time: 09/09/18 11:57 AM  Result Value Ref Range   ABO/RH(D)      A POS Performed at [facility], [place] [address] [place], [place] [address]   APTT     Status: None   Collection Time: 09/09/18 11:58 AM  Result Value Ref Range   aPTT 30 24 - 36 seconds    Comment: Performed at [facility], [place] [address], [place], [place] [address]  Protime-INR     Status: None   Collection Time: 09/09/18 11:58 AM  Result Value Ref Range   Prothrombin Time 12.6 11.4 - 15.2 seconds   INR 1.0 0.8 - 1.2    Comment: (NOTE) INR goal varies based on device and disease states. Performed at [facility], [place] [address] [place], [place] [address]   Type and screen Order type and screen if day of surgery is less than 15 days from draw of preadmission visit or order morning of surgery if day of surgery is greater than 6 days from preadmission visit.     Status: None   Collection Time: 09/09/18 11:58 AM  Result Value Ref Range   ABO/RH(D) A POS    Antibody Screen NEG    Sample Expiration 09/19/2018,2359    Extend sample reason      NO TRANSFUSIONS OR PREGNANCY IN THE PAST 3 MONTHS Performed at [facility], [place] [address] [place], [place] [address]   Urinalysis, Routine w reflex microscopic     Status: Abnormal   Collection Time: 09/09/18 11:58 AM  Result Value Ref Range   Color, Urine STRAW (A) YELLOW   APPearance CLEAR CLEAR   Specific Gravity, Urine 1.008 1.005 - 1.030   pH 6.0 5.0 - 8.0   Glucose, UA NEGATIVE NEGATIVE mg/dL   Hgb urine dipstick NEGATIVE NEGATIVE   Bilirubin Urine NEGATIVE NEGATIVE   Ketones, ur NEGATIVE NEGATIVE mg/dL   Protein, ur NEGATIVE NEGATIVE mg/dL   Nitrite NEGATIVE NEGATIVE   Leukocytes,Ua NEGATIVE NEGATIVE    Comment: Performed at [place] [address] [place], [place] [address]  Surgical pcr screen     Status: None   Collection Time: 09/09/18 11:58 AM   Specimen: Nasal Mucosa; Nasal Swab  Result Value Ref Range   MRSA, PCR NEGATIVE NEGATIVE   Staphylococcus aureus NEGATIVE NEGATIVE    Comment: (NOTE) The Xpert SA Assay (FDA approved for NASAL specimens in patients 73 years of age and older), is one component of a comprehensive surveillance program. It is not intended to diagnose infection nor to guide or monitor treatment. Performed at [facility], [place] [address], [place],  [address]   SARS Coronavirus 2 (Performed in [facility] hospital lab)     Status: None   Collection Time: 09/13/18  2:33 PM   Specimen: Nasal Swab  Result Value Ref Range   SARS Coronavirus 2 NEGATIVE NEGATIVE    Comment: (  NOTE) SARS-CoV-2 target nucleic acids are NOT DETECTED. The SARS-CoV-2 RNA is generally detectable in upper and lower respiratory specimens during the acute phase of infection. Negative results do not preclude SARS-CoV-2 infection, do not rule out co-infections with other pathogens, and should not be used as the sole basis for treatment or other patient management decisions. Negative results must be combined with clinical observations, patient history, and epidemiological information. The expected result is Negative. Fact Sheet for Patients: FlowerCheck.behttps://www.fda.gov/media/136695/download Fact Sheet for Healthcare Providers: https://www.smith-hall.com/https://www.fda.gov/media/136692/download This test is not yet approved or cleared by the [place] FDA and  has been authorized for detection and/or diagnosis of SARS-CoV-2 by FDA under an Emergency Use Authorization (EUA). This EUA will remain  in effect (meaning this test can be used) for the duration of the COVID-19 declaration under Section 56 4(b)(1) of the Act, 21 U.S.C. section 360bbb-3(b)(1), unless the authorization is terminated or revoked  sooner. Performed at [facility], [address] [place], [place] [address]     Estimated body mass index is 26.2 kg/m as calculated from the following:   Height as of 09/09/18: 6' (1.829 m).   Weight as of 09/09/18: 87.6 kg.   Imaging Review Plain radiographs demonstrate severe degenerative joint disease of the left knee(s). The overall alignment ismild varus. The bone quality appears to be fair for age and reported activity level.      Assessment/Plan:  End stage arthritis, left knee   The patient history, physical examination, clinical judgment of the provider and imaging studies are consistent with end stage degenerative joint disease of the left knee(s) and total knee arthroplasty is deemed medically necessary. The treatment options including medical management, injection therapy arthroscopy and arthroplasty were discussed at length. The risks and benefits of total knee arthroplasty were presented and reviewed. The risks due to aseptic loosening, infection, stiffness, patella tracking problems, thromboembolic complications and other imponderables were discussed. The patient acknowledged the explanation, agreed to proceed with the plan and consent was signed. Patient is being admitted for inpatient treatment for surgery, pain control, PT, OT, prophylactic antibiotics, VTE prophylaxis, progressive ambulation and ADL's and discharge planning. The patient is planning to be discharged home with home health services     Patient's anticipated LOS is less than 2 midnights, meeting these requirements: - Younger than 3665 - Lives within 1 hour of care - Has a competent adult at home to recover with post-op recover - NO history of  - Chronic pain requiring opiods  - Diabetes  - Coronary Artery Disease  - Heart failure  - Heart attack  - Stroke  - DVT/VTE  - Cardiac arrhythmia  - Respiratory Failure/COPD  - Renal failure  - Anemia  - Advanced Liver disease

## 2018-09-16 NOTE — Transfer of Care (Signed)
Immediate Anesthesia Transfer of Care Note  Patient: Bryan Mcgee  Procedure(s) Performed: TOTAL KNEE ARTHROPLASTY (Left )  Patient Location: PACU  Anesthesia Type:General  Level of Consciousness: awake, alert , oriented and patient cooperative  Airway & Oxygen Therapy: Patient Spontanous Breathing and Patient connected to face mask oxygen  Post-op Assessment: Report given to RN and Post -op Vital signs reviewed and stable  Post vital signs: stable  Last Vitals:  Vitals Value Taken Time  BP 147/74 09/16/18 1654  Temp    Pulse 72 09/16/18 1700  Resp 10 09/16/18 1700  SpO2 100 % 09/16/18 1700  Vitals shown include unvalidated device data.  Last Pain:  Vitals:   09/16/18 1654  TempSrc:   PainSc: (P) 5          Complications: No apparent anesthesia complications

## 2018-09-16 NOTE — Care Plan (Signed)
Ortho Bundle Case Management Note  Patient Details  Name: Bryan Mcgee MRN: [id number] Date of Birth: 24-May-1945   Spoke with patient prior to surgery. Will discharge to home with family and HHPT. Has walker. CPM ordered. He will transition to [place] following MD appointment   Questions answered. Patient and physician in agreement with plan. Choice offered.                   DME Arranged:  [facility] DME Agency:  [facility]  HH Arranged:  PT [place] Agency:  [facility]  Additional Comments: Please contact me with any questions of if this plan should need to change.  Ladell Heads,  [place] Specialist  [phone] 09/16/2018, 1:25 PM

## 2018-09-16 NOTE — Discharge Instructions (Signed)

## 2018-09-16 NOTE — Anesthesia Procedure Notes (Signed)
Anesthesia Regional Block: Adductor canal block   Pre-Anesthetic Checklist: ,, timeout performed, Correct Patient, Correct Site, Correct Laterality, Correct Procedure, Correct Position, site marked, Risks and benefits discussed,  Surgical consent,  Pre-op evaluation,  At surgeon's request and post-op pain management  Laterality: Left  Prep: Maximum Sterile Barrier Precautions used, chloraprep       Needles:  Injection technique: Single-shot  Needle Type: Echogenic Stimulator Needle     Needle Length: 9cm  Needle Gauge: 22     Additional Needles:   Procedures:,,,, ultrasound used (permanent image in chart),,,,  Narrative:  Start time: 09/16/2018 1:14 PM End time: 09/16/2018 1:24 PM Injection made incrementally with aspirations every 5 mL.  Performed by: Personally  Anesthesiologist: Freddrick March, MD  Additional Notes: Monitors applied. No increased pain on injection. No increased resistance to injection. Injection made in 5cc increments. Good needle visualization. Patient tolerated procedure well.

## 2018-09-16 NOTE — Anesthesia Procedure Notes (Signed)
Procedure Name: Intubation Date/Time: 09/16/2018 2:51 PM Performed by: Lissa Morales, CRNA Pre-anesthesia Checklist: Patient identified, Emergency Drugs available, Suction available and Patient being monitored Patient Re-evaluated:Patient Re-evaluated prior to induction Oxygen Delivery Method: Circle system utilized Preoxygenation: Pre-oxygenation with 100% oxygen Induction Type: IV induction Ventilation: Mask ventilation without difficulty Laryngoscope Size: Mac and 4 Grade View: Grade I Tube type: Oral Tube size: 8.0 mm Number of attempts: 1 Airway Equipment and Method: Stylet and Oral airway Placement Confirmation: ETT inserted through vocal cords under direct vision,  positive ETCO2 and breath sounds checked- equal and bilateral Secured at: 23 cm Tube secured with: Tape (chips noted both front teeth) Dental Injury: Teeth and Oropharynx as per pre-operative assessment

## 2018-09-16 NOTE — Progress Notes (Signed)
Family called into PACU and notified pt still in surgery

## 2018-09-16 NOTE — Anesthesia Postprocedure Evaluation (Addendum)
Anesthesia Post Note  Patient: Bryan Mcgee  Procedure(s) Performed: TOTAL KNEE ARTHROPLASTY (Left )     Patient location during evaluation: PACU Anesthesia Type: General Level of consciousness: oriented and awake and alert Pain management: pain level controlled Vital Signs Assessment: post-procedure vital signs reviewed and stable Respiratory status: spontaneous breathing, respiratory function stable, patient connected to nasal cannula oxygen and nonlabored ventilation Cardiovascular status: blood pressure returned to baseline and stable Postop Assessment: no headache, no backache, no apparent nausea or vomiting, spinal receding and patient able to bend at knees Anesthetic complications: no    Last Vitals:  Vitals:   09/16/18 1745 09/16/18 1800  BP: 133/66 138/72  Pulse: 65 67  Resp: 15 15  Temp:    SpO2: 100% 100%    Last Pain:  Vitals:   09/16/18 1745  TempSrc:   PainSc: 5                  Lisaanne Lawrie A.

## 2018-09-16 NOTE — Anesthesia Preprocedure Evaluation (Addendum)
Anesthesia Evaluation  Patient identified by MRN, date of birth, ID band Patient awake    Reviewed: Allergy & Precautions, NPO status , Patient's Chart, lab work & pertinent test results  Airway Mallampati: II  TM Distance: >3 FB Neck ROM: Full    Dental no notable dental hx. (+) Teeth Intact, Dental Advisory Given   Pulmonary neg pulmonary ROS,    Pulmonary exam normal breath sounds clear to auscultation       Cardiovascular negative cardio ROS Normal cardiovascular exam Rhythm:Regular Rate:Normal     Neuro/Psych negative neurological ROS  negative psych ROS   GI/Hepatic negative GI ROS, Neg liver ROS,   Endo/Other  negative endocrine ROS  Renal/GU negative Renal ROS  negative genitourinary   Musculoskeletal  (+) Arthritis ,   Abdominal   Peds  Hematology negative hematology ROS (+)   Anesthesia Other Findings   Reproductive/Obstetrics                            Anesthesia Physical Anesthesia Plan  ASA: II  Anesthesia Plan: Regional and General   Post-op Pain Management:  Regional for Post-op pain   Induction:   PONV Risk Score and Plan: 2 and Treatment may vary due to age or medical condition, Propofol infusion, Ondansetron, Dexamethasone and Midazolam  Airway Management Planned: Oral ETT  Additional Equipment:   Intra-op Plan:   Post-operative Plan: Extubation in OR  Informed Consent: I have reviewed the patients History and Physical, chart, labs and discussed the procedure including the risks, benefits and alternatives for the proposed anesthesia with the patient or authorized representative who has indicated his/her understanding and acceptance.     Dental advisory given  Plan Discussed with: CRNA  Anesthesia Plan Comments: (Pt declines spinal anesthesia)      Anesthesia Quick Evaluation

## 2018-09-16 NOTE — Progress Notes (Signed)
Assisted Dr. Lanetta Inch with left, ultrasound guided, adductor canal block. Side rails up, monitors on throughout procedure. See vital signs in flow sheet. Tolerated Procedure well.

## 2018-09-16 NOTE — Op Note (Signed)
NAME: Bryan Mcgee, Bryan E. MEDICAL RECORD [id number] ACCOUNT [id number] DATE OF BIRTH:[birth year] FAC[facility] LOCATION: [facility] PHYSICIAN:Nael Petrosyan L. Breshae Belcher, MD  OPERATIVE REPORT  DATE OF PROCEDURE:  09/16/2018  PREOPERATIVE DIAGNOSIS:  End-stage degenerative joint disease, left knee.  POSTOPERATIVE DIAGNOSIS:  End-stage degenerative joint disease, left knee.  PROCEDURE:  Left total knee replacement with an Attune system, size 8 femur, size 8 tibia, 5 mm bridging bearing, and a 41 mm all polyethylene patella.  SURGEON:  Jodi GeraldsJohn Genine Beckett, MD  ASSISTANT:  Gus PumaJim Bethune PA-C, was present for the entire case and assisted by retraction, bone cuts, and closing to minimize OR time.  BRIEF HISTORY:  The patient is a 73 year old male with a long history of complaints of left knee pain that has been treated conservatively for a long period of time.  He failed conservative care including injection therapy, activity modification, and he  was having night pain and light activity pain and he was brought to the operating room for left total knee replacement.  His x-rays preoperatively showed bone-on-bone change.  DESCRIPTION OF PROCEDURE:  The patient brought to the operating room after adequate anesthesia was obtained with a general anesthetic, the patient was taken to the operating table.  Left leg was prepped and draped in usual sterile fashion.  Following  this, the leg was exsanguinated, blood pressure tourniquet inflated to 300 mmHg.  Following this, a midline incision was made, subcutaneous tissue dissected down to the level of extensor mechanism.  Medial parapatellar arthrotomy was undertaken.   Following this, the medial and lateral meniscus were removed, retropatellar fat pad, synovium on the anterior aspect of the femur, and the anterior and posterior cruciates.  At this point, an intramedullary pilot hole was drilled in the femur and a  4-degree valgus inclination cut is made with 9 mm of distal bone  resected.  Attention was then turned to sizing the femur.  It sized to an 8.  Anterior and posterior cuts were made chamfers and box.  Attention was turned to the tibia.  It was cut  perpendicular to its long axis with a 3 degree posterior slope and once this was done, the tibia sized to an 8.  It was drilled and keeled.  Attention was turned to the patella cut down to the level of 14 mm and a 41 paddle was chosen and lugs were  drilled for the patella.  At this point, the knee was put through a range of motion, excellent stability and range of motion are achieved.  The trial components were removed.  The knee was copiously and thoroughly lavaged.  Pulsatile lavage irrigation  and suctioned dry.  The final components were cemented into place, size 8 femur, size 8 tibia, 5 mm bridging bearing trial was placed and a 41 all poly patella was placed and held with a clamp.  At this point, the tourniquet is let down after all of the  cement has completely hardened and all excess bone cement has been removed with tools.  At this point, the wound was irrigated and the final trial poly was removed.  The final poly was placed and the medial parapatellar arthrotomy was closed with 1  Vicryl running.  The skin was closed with 0 and 2-0 Vicryl and 3-0 Monocryl subcuticular.  Benzoin and Steri-Strips were applied.  Sterile compressive dressing was applied and the patient was taken to the recovery and noted to be in satisfactory  condition.  Estimated blood loss for the procedure is 300 mL.  TN/NUANCE  D:09/16/2018 T:09/16/2018 JOB:[id number]

## 2018-09-17 DIAGNOSIS — M1712 Unilateral primary osteoarthritis, left knee: Secondary | ICD-10-CM | POA: Diagnosis not present

## 2018-09-17 LAB — CBC
HCT: 39 % (ref 39.0–52.0)
Hemoglobin: 12.4 g/dL — ABNORMAL LOW (ref 13.0–17.0)
MCH: 30.4 pg (ref 26.0–34.0)
MCHC: 31.8 g/dL (ref 30.0–36.0)
MCV: 95.6 fL (ref 80.0–100.0)
Platelets: 181 10*3/uL (ref 150–400)
RBC: 4.08 MIL/uL — ABNORMAL LOW (ref 4.22–5.81)
RDW: 12.4 % (ref 11.5–15.5)
WBC: 9.9 10*3/uL (ref 4.0–10.5)
nRBC: 0 % (ref 0.0–0.2)

## 2018-09-17 NOTE — TOC Initial Note (Signed)
Transition of Care [facility]) - Initial/Assessment Note    Patient Details  Name: Bryan Mcgee MRN: [id number] Date of Birth: 02-21-1946  [facility] CM/SW Contact:    Joaquin Courts, RN Phone Number: 09/17/2018, 10:52 AM  Clinical Narrative:    CM spoke with patient at bedside. [facility], reports has a rolling walker and declines 3-in-1.                 Expected Discharge Plan: [place] Barriers to Discharge: No Barriers Identified   Patient Goals and CMS Choice Patient states their goals for this hospitalization and ongoing recovery are:: to get better CMS Medicare.gov Compare Post Acute Care list provided to:: Patient Choice offered to / list presented to : Patient  Expected Discharge Plan and Services Expected Discharge Plan: [place]   Discharge Planning Services: CM Consult Post Acute Care Choice: [place] arrangements for the past 2 months: Single Family Home Expected Discharge Date: 09/17/18               DME Arranged: N/A DME Agency: NA       HH Arranged: PT HH Agency: [facility] [employer] Date [place]: 09/17/18 Time [place]: 1 Representative spoke with at [place]: pre arranged by md office  Prior Living Arrangements/Services Living arrangements for the past 2 months: [place] Lives with:: Spouse Patient language and need for interpreter reviewed:: Yes Do you feel safe going back to the place where you live?: Yes      Need for Family Participation in Patient Care: Yes (Comment) Care giver support system in place?: Yes (comment)   Criminal Activity/Legal Involvement Pertinent to Current Situation/Hospitalization: No - Comment as needed  Activities of Daily Living Home Assistive Devices/Equipment: Cane (specify quad or straight), Eyeglasses, Walker (specify type) ADL Screening (condition at  time of admission) Patient's cognitive ability adequate to safely complete daily activities?: Yes Is the patient deaf or have difficulty hearing?: No Does the patient have difficulty seeing, even when wearing glasses/contacts?: No Does the patient have difficulty concentrating, remembering, or making decisions?: No Patient able to express need for assistance with ADLs?: Yes Does the patient have difficulty dressing or bathing?: No Independently performs ADLs?: Yes (appropriate for developmental age) Does the patient have difficulty walking or climbing stairs?: Yes Weakness of Legs: None Weakness of Arms/Hands: None  Permission Sought/Granted                  Emotional Assessment Appearance:: Appears stated age Attitude/Demeanor/Rapport: Engaged Affect (typically observed): Accepting Orientation: : Oriented to Place, Oriented to  Time, Oriented to Situation, Oriented to Self   Psych Involvement: No (comment)  Admission diagnosis:  END STAGE DEGENERATIVE JOINT DISEASE Patient Active Problem List   Diagnosis Date Noted  . Primary osteoarthritis of left knee 09/16/2018  . Lumbar radiculopathy 05/27/2018  . Constipation 05/26/2018  . Radicular pain of right lower extremity 05/26/2018  . 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 Chronic bilateral low back pain without sciatica 06/09/2016  . Pure hypercholesterolemia 01/09/2016  . Benign prostatic hyperplasia 01/20/2013   PCP:  Jene Every, MD Pharmacy:   [id number] - [place], [place] [address] Phone: [phone] Fax: [phone]     Social Determinants of Health (SDOH) Interventions  Readmission Risk Interventions No flowsheet data found.

## 2018-09-17 NOTE — Care Management Obs Status (Signed)
[place] NOTIFICATION   Patient Details  Name: Bryan Mcgee MRN: [id number] Date of Birth: 1945-04-09   Medicare Observation Status Notification Given:  Yes    Joaquin Courts, RN 09/17/2018, 9:44 AM

## 2018-09-17 NOTE — Progress Notes (Signed)
    Home health agencies that serve [phone].        [place] Quality of Patient Care Rating Patient Survey Summary Rating  [facility] [phone] 4 out of 5 stars 4 out of [place] [phone] 4 out of 5 stars 4 out of [place] [phone] 4 out of 5 stars 4 out of [place] [phone] 4 out of 5 stars 4 out of [place] [phone] 3 out of 5 stars 4 out of [place] [phone] 3 out of 5 stars 4 out of [place] [phone] 4 out of 5 stars 5 out of [place] [phone] 5 out of 5 stars 4 out of [place] [phone] 4  out of 5 stars 3 out of [place] number Footnote as displayed on [place]  1 This agency provides services under a federal waiver program to non-traditional, chronic long term population.  2 This agency provides services to a special needs population.  3 Not Available.  4 The number of patient episodes for this measure is too small to report.  5 This measure currently does not have data or provider has been certified/recertified for less than 6 months.  6 The national average for this measure is not provided because of state-to-state differences in data collection.  7 Medicare is not displaying rates for this measure for any home health agency, because of an issue with the data.  8 There were problems with the data and they are being corrected.  9 Zero, or very few, patients met the survey's rules for inclusion. The scores shown, if any, reflect a very small number of surveys and may not accurately tell how an agency is doing.  10 Survey results are based on less than 12 months of data.  11  Fewer than 70 patients completed the survey. Use the scores shown, if any, with caution as the number of surveys may be too low to accurately tell how an agency is doing.  12 No survey results are available for this period.  13 Data suppressed by CMS for one or more quarters.

## 2018-09-17 NOTE — Discharge Summary (Signed)
Patient ID: Bryan Mcgee MRN: [id number] DOB/AGE: 1946/01/13 73 y.o.  Admit date: 09/16/2018 Discharge date: 09/17/2018  Admission Diagnoses:  Principal Problem:   Primary osteoarthritis of left knee   Discharge Diagnoses:  Same  Past Medical History:  Diagnosis Date  . Arthritis     Surgeries: Procedure(s): TOTAL KNEE ARTHROPLASTY on 09/16/2018   Consultants:   Discharged Condition: Improved  Hospital Course: Bryan Mcgee is an 73 y.o. male who was admitted 09/16/2018 for operative treatment ofPrimary osteoarthritis of left knee. Patient has severe unremitting pain that affects sleep, daily activities, and work/hobbies. After pre-op clearance the patient was taken to the operating room on 09/16/2018 and underwent  Procedure(s): TOTAL KNEE ARTHROPLASTY.    Patient was seen on postoperative day 1.  He was doing well.  Pain was controlled.  He was icing his knee.  Denies any shortness of breath or chest pain.  Has not cleared physical therapy yet.  Exam: Left knee with dressing in place.  Ice on the dressing.  No tenderness palpation distal to the dressing.  Able to dorsiflex and plantarflex ankle.  Sensation intact on the dorsal plantar foot.  Foot is warm well perfused. Respirations even unlabored No acute distress Abdomen soft  Patient was given perioperative antibiotics:  Anti-infectives (From admission, onward)   Start     Dose/Rate Route Frequency Ordered Stop   09/16/18 2100  ceFAZolin (ANCEF) IVPB 2g/100 mL premix     2 g 200 mL/hr over 30 Minutes Intravenous Every 6 hours 09/16/18 1848 09/17/18 0307   09/16/18 1200  ceFAZolin (ANCEF) IVPB 2g/100 mL premix     2 g 200 mL/hr over 30 Minutes Intravenous On call to O.R. 09/16/18 1148 09/16/18 1445       Patient was given sequential compression devices, early ambulation, and chemoprophylaxis to prevent DVT.  Patient benefited maximally from hospital stay and there were no complications.    Recent vital signs:   Patient Vitals for the past 24 hrs:  BP Temp Temp src Pulse Resp SpO2 Height Weight  09/17/18 0434 123/75 97.6 F (36.4 C) Oral (!) 58 14 100 % - -  09/17/18 0024 124/75 98 F (36.7 C) Oral (!) 57 16 100 % - -  09/16/18 2246 140/84 97.8 F (36.6 C) - (!) 56 14 100 % - -  09/16/18 2138 139/76 97.7 F (36.5 C) - 64 16 99 % - -  09/16/18 2033 136/78 (!) 97.5 F (36.4 C) - 61 18 100 % - -  09/16/18 1902 139/74 (!) 97.4 F (36.3 C) Axillary 65 16 100 % - -  09/16/18 1840 138/72 (!) 96.8 F (36 C) - (!) 58 10 100 % - -  09/16/18 1830 140/76 - - 66 (!) 9 100 % - -  09/16/18 1815 136/73 - - 64 10 100 % - -  09/16/18 1800 138/72 - - 67 15 100 % - -  09/16/18 1745 133/66 - - 65 15 100 % - -  09/16/18 1730 (!) 143/75 - - 67 (!) 8 100 % - -  09/16/18 1715 (!) 143/76 - - 69 (!) 8 100 % - -  09/16/18 1700 140/75 - - 72 14 100 % - -  09/16/18 1654 (!) 147/74 (!) 97.5 F (36.4 C) - 73 10 100 % - -  09/16/18 1403 - - - (!) 52 15 100 % - -  09/16/18 1400 - - - (!) 48 16 100 % - -  09/16/18 1357 - - - Marland Kitchen)  49 14 100 % - -  09/16/18 1355 - - - (!) 47 15 100 % - -  09/16/18 1354 - - - (!) 47 14 100 % - -  09/16/18 1351 113/66 - - (!) 52 14 99 % - -  09/16/18 1350 - - - (!) 48 15 100 % - -  09/16/18 1348 - - - (!) 47 15 99 % - -  09/16/18 1345 - - - (!) 51 14 99 % - -  09/16/18 1342 - - - (!) 47 13 99 % - -  09/16/18 1340 - - - (!) 51 15 100 % - -  09/16/18 1339 - - - (!) 48 14 99 % - -  09/16/18 1338 - - - (!) 47 13 99 % - -  09/16/18 1336 116/67 - - (!) 48 13 99 % - -  09/16/18 1335 - - - (!) 47 14 99 % - -  09/16/18 1334 - - - (!) 48 12 99 % - -  09/16/18 1332 - - - (!) 48 12 99 % - -  09/16/18 1330 - - - (!) 48 13 100 % - -  09/16/18 1328 - - - (!) 51 14 100 % - -  09/16/18 1326 - - - (!) 50 12 99 % - -  09/16/18 1325 - - - (!) 52 13 99 % - -  09/16/18 1324 - - - (!) 57 13 99 % - -  09/16/18 1323 - - - (!) 46 11 99 % - -  09/16/18 1322 - - - (!) 52 13 99 % - -  09/16/18 1321 132/81 -  - (!) 50 15 99 % - -  09/16/18 1320 - - - (!) 50 15 99 % - -  09/16/18 1319 - - - (!) 50 15 99 % - -  09/16/18 1318 - - - 60 19 98 % - -  09/16/18 1317 - - - 61 12 99 % - -  09/16/18 1201 136/81 97.6 F (36.4 C) Oral (!) 57 16 100 % - -  09/16/18 1151 - - - - - - 6' (1.829 m) 87.6 kg     Recent laboratory studies:  Recent Labs    09/17/18 0257  WBC 9.9  HGB 12.4*  HCT 39.0  PLT 181     Discharge Medications:   Allergies as of 09/17/2018   No Known Allergies     Medication List    STOP taking these medications   Calcium 600+D Plus Minerals 600-400 MG-UNIT Tabs   cyclobenzaprine 10 MG tablet Commonly known as: FLEXERIL   Glucosamine-Chondroitin 250-200 MG Caps   HYDROcodone-acetaminophen 5-325 MG tablet Commonly known as: NORCO/VICODIN   Multi-Vitamins Tabs   naproxen 500 MG tablet Commonly known as: NAPROSYN     TAKE these medications   aspirin EC 325 MG tablet Take 1 tablet (325 mg total) by mouth 2 (two) times daily after a meal. Take x 1 month post op to decrease risk of blood clots. What changed:   medication strength  how much to take  when to take this  additional instructions   docusate sodium 100 MG capsule Commonly known as: Colace Take 1 capsule (100 mg total) by mouth 2 (two) times daily.   oxyCODONE-acetaminophen 5-325 MG tablet Commonly known as: PERCOCET/ROXICET Take 1-2 tablets by mouth every 6 (six) hours as needed for severe pain.   tiZANidine 2 MG tablet Commonly known as: ZANAFLEX Take 1 tablet (2  mg total) by mouth every 8 (eight) hours as needed for muscle spasms.       Diagnostic Studies: Dg Chest 2 View  Result Date: 09/09/2018 CLINICAL DATA:  Pre knee surgery evaluation. EXAM: CHEST - 2 VIEW COMPARISON:  10/15/2006. FINDINGS: Normal sized heart. Clear lungs. Mild central peribronchial thickening. Minimal thoracic spine degenerative changes. IMPRESSION: No acute abnormality.  Mild chronic bronchitic changes.  Electronically Signed   By: Beckie SaltsSteven  Reid M.D.   On: 09/09/2018 17:16    Disposition: Discharge disposition: 01-Home or Self Care       Discharge Instructions    Call MD / Call 911   Complete by: As directed    If you experience chest pain or shortness of breath, CALL 911 and be transported to the hospital emergency room.  If you develope a fever above 101 F, pus (white drainage) or increased drainage or redness at the wound, or calf pain, call your surgeon's office.   Constipation Prevention   Complete by: As directed    Drink plenty of fluids.  Prune juice may be helpful.  You may use a stool softener, such as Colace (over the counter) 100 mg twice a day.  Use MiraLax (over the counter) for constipation as needed.   Diet - low sodium heart healthy   Complete by: As directed    Increase activity slowly as tolerated   Complete by: As directed       Follow-up Information    Jodi GeraldsGraves, John, MD. Go on 09/26/2018.   Specialty: Orthopedic Surgery Why: your appointment is scheduled for 12:15 Contact information: [address] [place] [address] [phone]        [facility] Follow up.   Specialty: Home Health Services Why: You will be seen by  HHPT for 5 visits prior to starting OPPT. Contact information: [address] [place] [address] [phone]        [facility].   Why: You are scheduled to start outpatient physical therapy at 1:00. Please arrive a few minutes early to complete your paperwork.        Schedule an appointment as soon as possible for a visit in 2 weeks to follow up.            Signed: Terance HartChristopher R Jashira Cotugno 09/17/2018, 6:57 AM

## 2018-09-17 NOTE — Plan of Care (Signed)
  Problem: Education: Goal: Knowledge of General Education information will improve Description: Including pain rating scale, medication(s)/side effects and non-pharmacologic comfort measures Outcome: Adequate for Discharge   Problem: Health Behavior/Discharge Planning: Goal: Ability to manage health-related needs will improve Outcome: Adequate for Discharge   Problem: Clinical Measurements: Goal: Ability to maintain clinical measurements within normal limits will improve Outcome: Adequate for Discharge Goal: Will remain free from infection Outcome: Adequate for Discharge Goal: Diagnostic test results will improve Outcome: Adequate for Discharge Goal: Respiratory complications will improve Outcome: Adequate for Discharge Goal: Cardiovascular complication will be avoided Outcome: Adequate for Discharge   Problem: Activity: Goal: Risk for activity intolerance will decrease Outcome: Adequate for Discharge   Problem: Nutrition: Goal: Adequate nutrition will be maintained Outcome: Adequate for Discharge   Problem: Coping: Goal: Level of anxiety will decrease Outcome: Adequate for Discharge   Problem: Elimination: Goal: Will not experience complications related to bowel motility Outcome: Adequate for Discharge Goal: Will not experience complications related to urinary retention Outcome: Adequate for Discharge   Problem: Pain Managment: Goal: General experience of comfort will improve Outcome: Adequate for Discharge   Problem: Safety: Goal: Ability to remain free from injury will improve Outcome: Adequate for Discharge   Problem: Skin Integrity: Goal: Risk for impaired skin integrity will decrease Outcome: Adequate for Discharge   Problem: Education: Goal: Knowledge of the prescribed therapeutic regimen will improve Outcome: Adequate for Discharge Goal: Individualized Educational Video(s) Outcome: Adequate for Discharge   Problem: Activity: Goal: Ability to avoid  complications of mobility impairment will improve Outcome: Adequate for Discharge Goal: Range of joint motion will improve Outcome: Adequate for Discharge   Problem: Clinical Measurements: Goal: Postoperative complications will be avoided or minimized Outcome: Adequate for Discharge   Problem: Pain Management: Goal: Pain level will decrease with appropriate interventions Outcome: Adequate for Discharge   Problem: Skin Integrity: Goal: Will show signs of wound healing Outcome: Adequate for Discharge  Home with wife. Discharge teaching done. Written information given.   

## 2018-09-17 NOTE — Evaluation (Signed)
Physical Therapy Evaluation Patient Details Name: Bryan Mcgee MRN: [id number] DOB: 01/06/1946 Today's Date: 09/17/2018   History of Present Illness  Pt s/p L TKR and with hx of back surgery  Clinical Impression  Pt s/p L TKR and presents with decreased L LE strength/ROM and post op pain limiting functional mobility.  Pt should progress to dc home with family assist.    Follow Up Recommendations Follow surgeon's recommendation for DC plan and follow-up therapies;Home health PT    Equipment Recommendations  None recommended by PT    Recommendations for Other Services       Precautions / Restrictions Precautions Precautions: Knee;Fall Restrictions Weight Bearing Restrictions: No      Mobility  Bed Mobility Overal bed mobility: Needs Assistance Bed Mobility: Supine to Sit     Supine to sit: Min guard     General bed mobility comments: safety  Transfers Overall transfer level: Needs assistance Equipment used: Rolling walker (2 wheeled) Transfers: Sit to/from Stand Sit to Stand: Min guard;Supervision         General transfer comment: cues for LE management and use of UEs to self assist  Ambulation/Gait Ambulation/Gait assistance: Min guard;Supervision Gait Distance (Feet): 150 Feet(and 15' into bathroom) Assistive device: Rolling walker (2 wheeled) Gait Pattern/deviations: Step-to pattern;Step-through pattern;Decreased step length - right;Decreased step length - left;Shuffle;Trunk flexed Gait velocity: decr   General Gait Details: cues for posture, position from RW and initial sequence  Stairs            Wheelchair Mobility    Modified Rankin (Stroke Patients Only)       Balance Overall balance assessment: Mild deficits observed, not formally tested                                           Pertinent Vitals/Pain Pain Assessment: 0-10 Pain Score: 4  Pain Location: L knee Pain Descriptors / Indicators: Aching;Sore Pain  Intervention(s): Limited activity within patient's tolerance;Monitored during session;Premedicated before session;Ice applied    Home Living Family/patient expects to be discharged to:: Private residence Living Arrangements: Spouse/significant other Available Help at Discharge: Family;Available 24 hours/day Type of Home: House Home Access: Ramped entrance     Home Layout: Able to live on main level with bedroom/bathroom Home Equipment: Cane - single point;Crutches;Shower seat      Prior Function Level of Independence: Independent               Hand Dominance        Extremity/Trunk Assessment   Upper Extremity Assessment Upper Extremity Assessment: Overall WFL for tasks assessed    Lower Extremity Assessment Lower Extremity Assessment: LLE deficits/detail LLE Deficits / Details: AAROM at L knee -10 - 95    Cervical / Trunk Assessment Cervical / Trunk Assessment: Normal  Communication   Communication: No difficulties  Cognition Arousal/Alertness: Awake/alert Behavior During Therapy: WFL for tasks assessed/performed Overall Cognitive Status: Within Functional Limits for tasks assessed                                        General Comments      Exercises Total Joint Exercises Ankle Circles/Pumps: AROM;Both;15 reps;Supine Quad Sets: AROM;Both;10 reps;Supine Heel Slides: AAROM;Left;15 reps;Supine Straight Leg Raises: Left;AAROM;AROM;15 reps;Supine   Assessment/Plan    PT Assessment Patient needs  continued PT services  PT Problem List Decreased strength;Decreased range of motion;Decreased activity tolerance;Decreased mobility;Decreased knowledge of use of DME;Pain       PT Treatment Interventions DME instruction;Gait training;Stair training;Functional mobility training;Therapeutic activities;Therapeutic exercise;Patient/family education    PT Goals (Current goals can be found in the Care Plan section)  Acute Rehab PT Goals Patient Stated  Goal: RegaiN IND PT Goal Formulation: All assessment and education complete, DC therapy    Frequency 7X/week   Barriers to discharge        Co-evaluation               AM-PAC PT "6 Clicks" Mobility  Outcome Measure Help needed turning from your back to your side while in a flat bed without using bedrails?: A Little Help needed moving from lying on your back to sitting on the side of a flat bed without using bedrails?: A Little Help needed moving to and from a bed to a chair (including a wheelchair)?: A Little Help needed standing up from a chair using your arms (e.g., wheelchair or bedside chair)?: A Little Help needed to walk in hospital room?: A Little Help needed climbing 3-5 steps with a railing? : A Little 6 Click Score: 18    End of Session Equipment Utilized During Treatment: Gait belt Activity Tolerance: Patient tolerated treatment well Patient left: in chair;with call bell/phone within reach Nurse Communication: Mobility status PT Visit Diagnosis: Difficulty in walking, not elsewhere classified (R26.2)    Time: 2952-8413 PT Time Calculation (min) (ACUTE ONLY): 35 min   Charges:   PT Evaluation $PT Eval Low Complexity: 1 Low PT Treatments $Gait Training: 8-22 mins        [place] Pager [phone] Office [phone]   Ajanee Buren 09/17/2018, 12:50 PM

## 2018-09-19 ENCOUNTER — Encounter (HOSPITAL_COMMUNITY): Payer: Self-pay | Admitting: Orthopedic Surgery

## 2020-05-22 IMAGING — DX CHEST - 2 VIEW
2 series · 2 of 2 positions shown · non-contrast
Comparison: 10/15/2006.

CLINICAL DATA: Pre knee surgery evaluation.

EXAM:
CHEST - 2 VIEW

[chest pa]
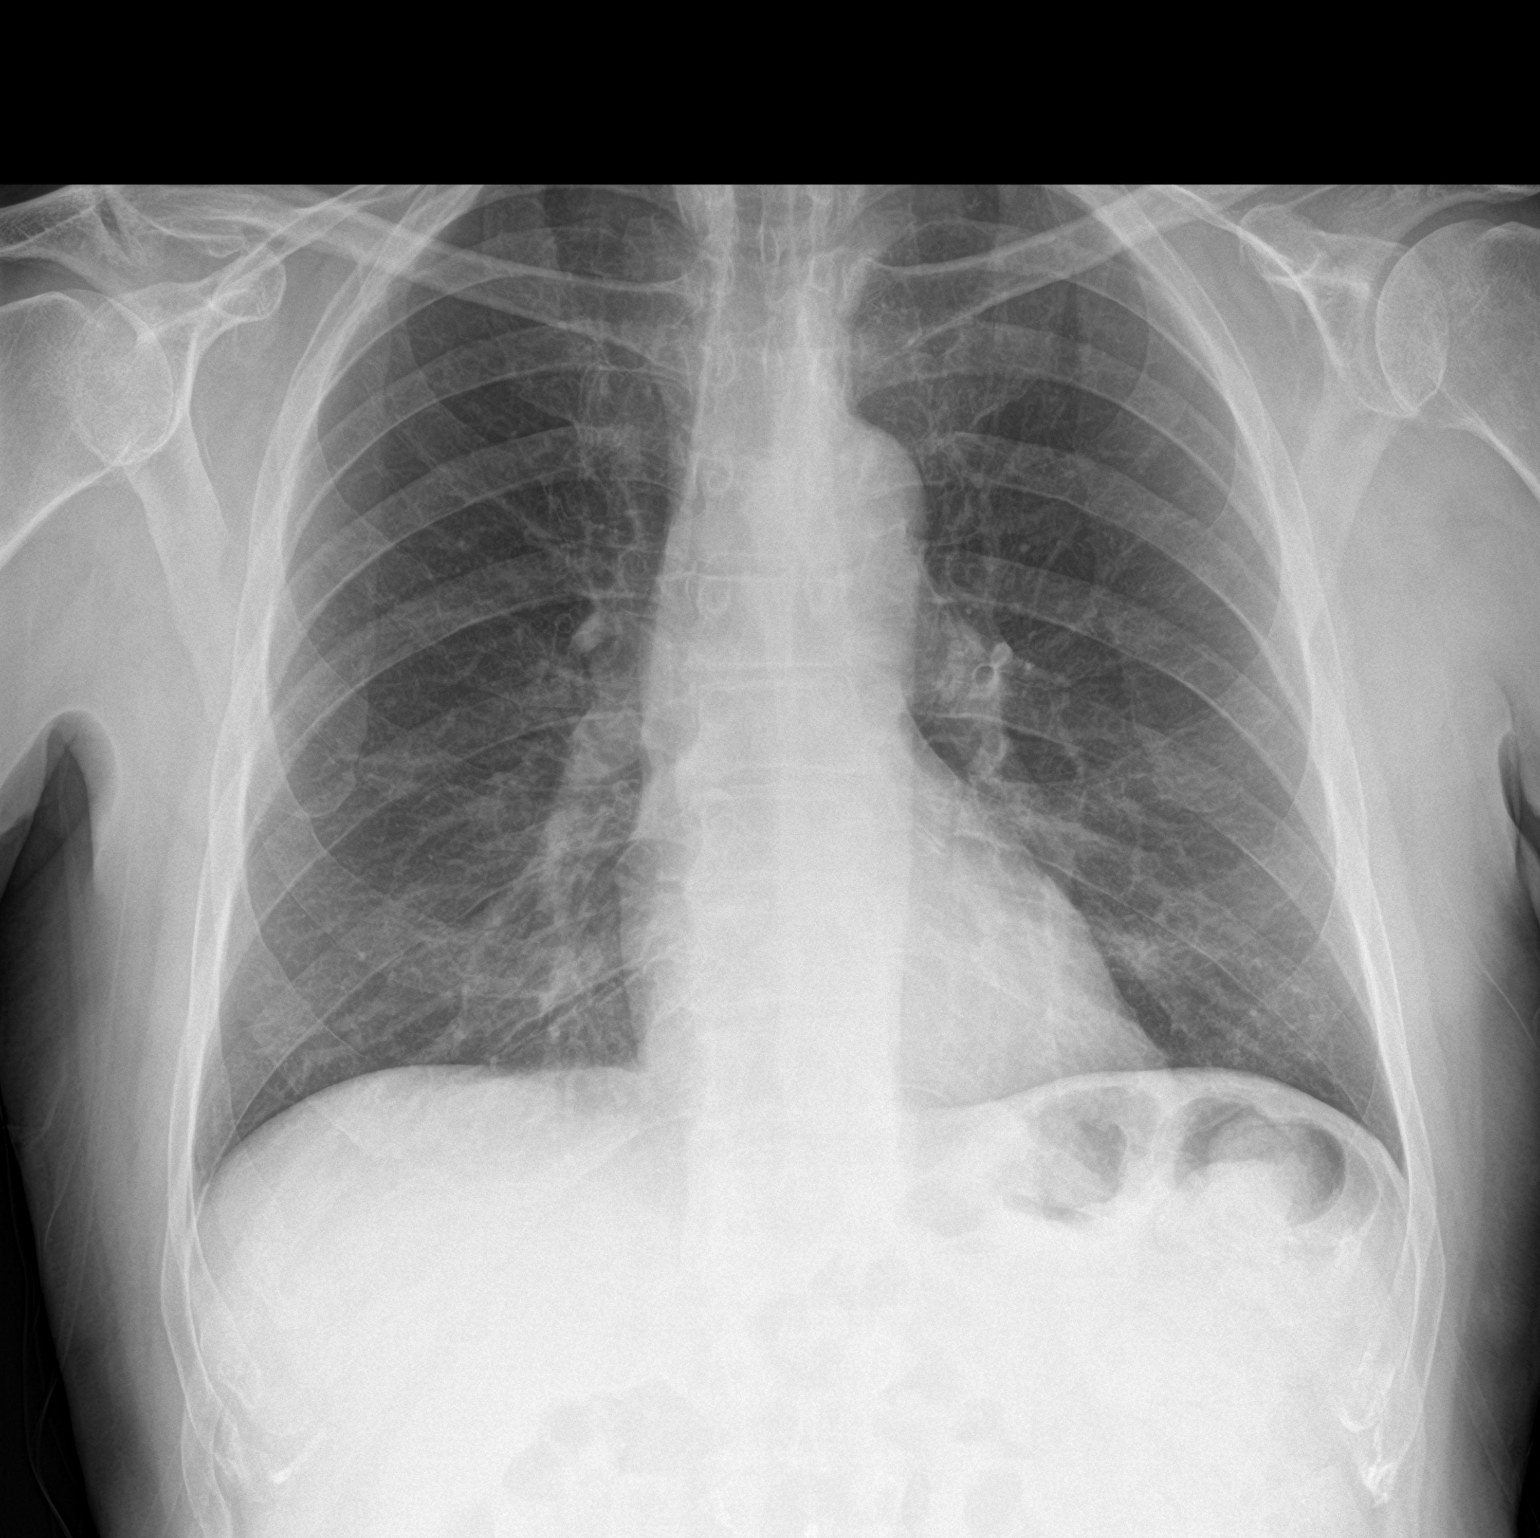

[chest lat]
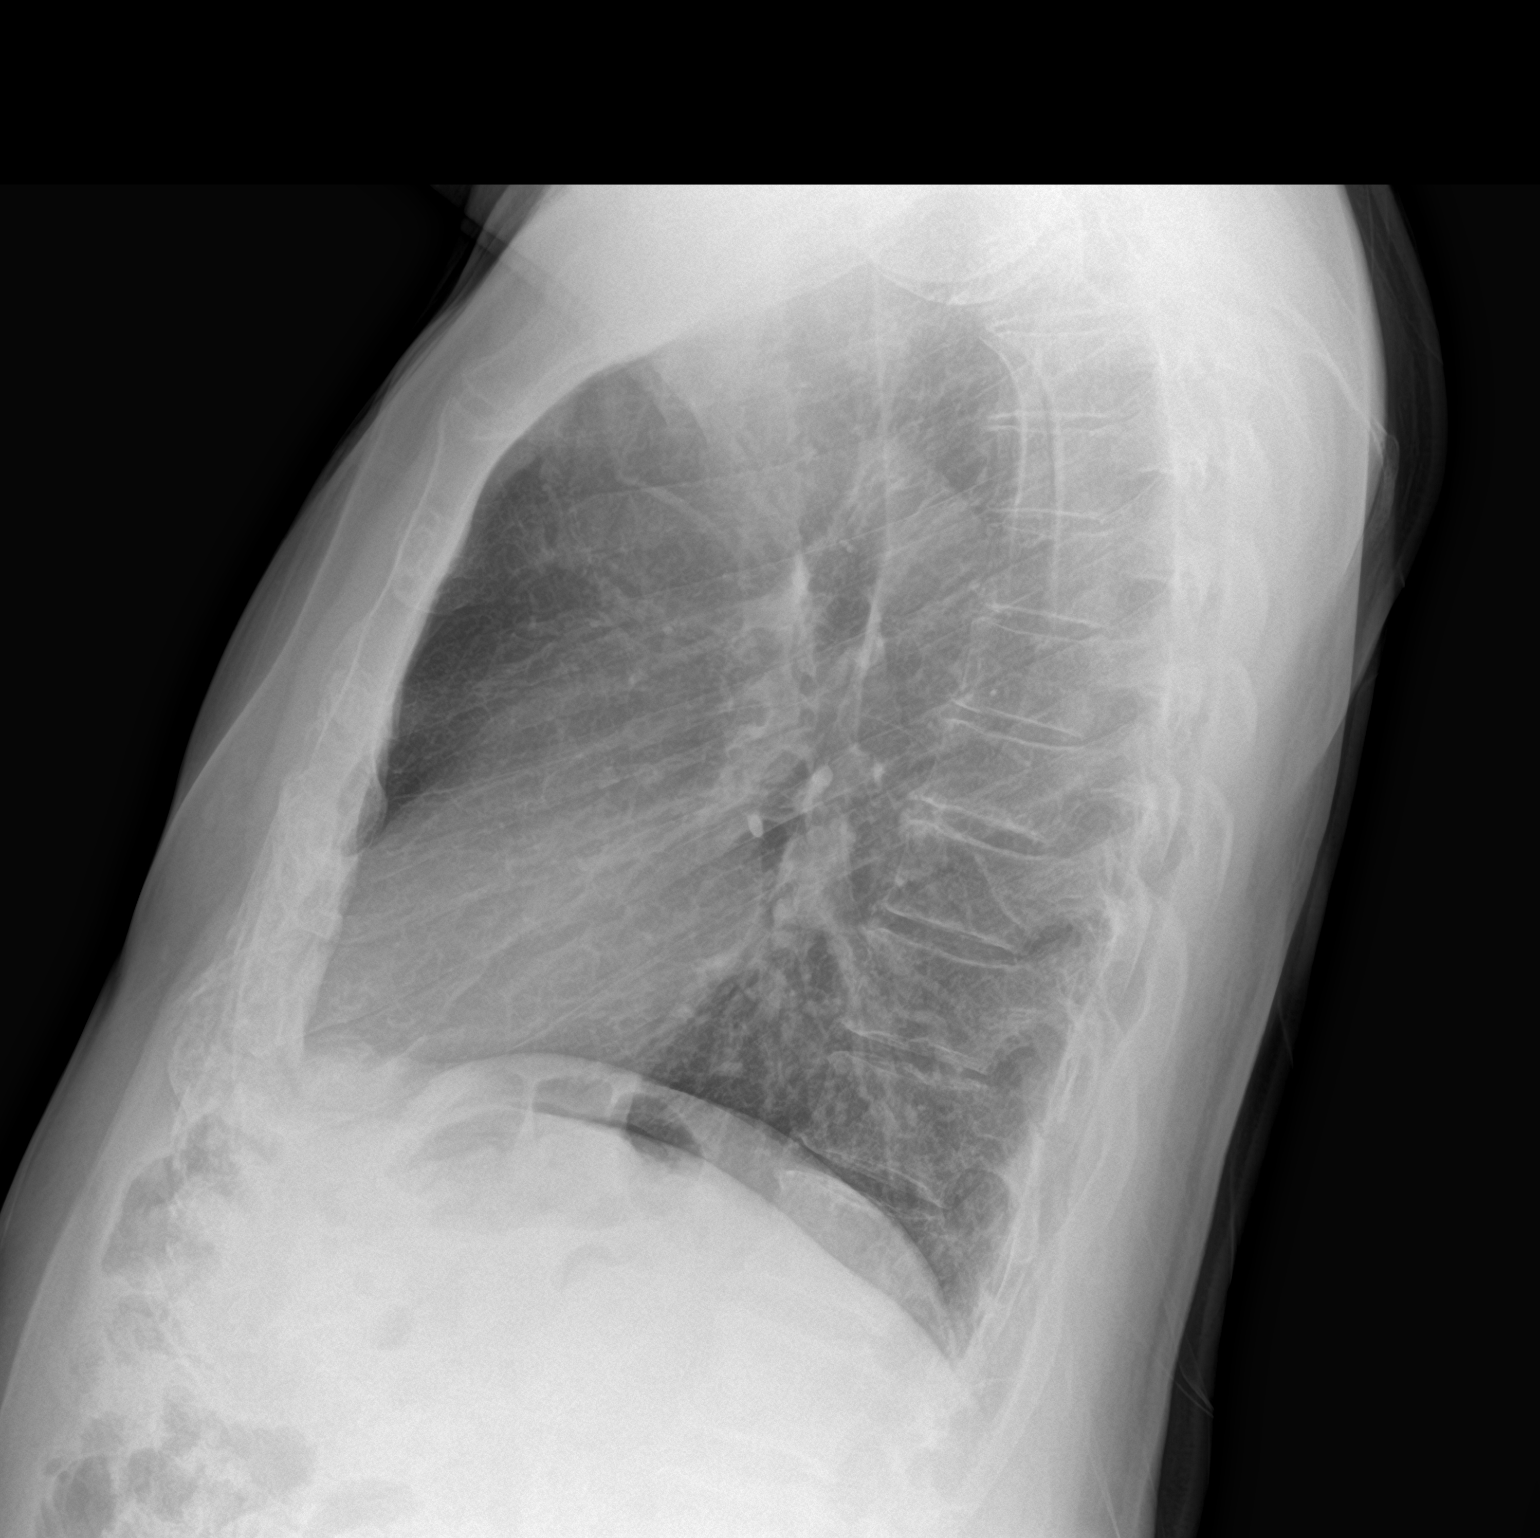

[2 of 2 positions shown; findings below may reference images not displayed]

FINDINGS: Normal sized heart. Clear lungs. Mild central peribronchial
thickening. Minimal thoracic spine degenerative changes.
IMPRESSION: No acute abnormality.  Mild chronic bronchitic changes.
